# Patient Record
Sex: Male | Born: 1965
Health system: Southern US, Community
[De-identification: ages and names within clinical notes are randomized; demographics above are authoritative.]

## PROBLEM LIST (undated history)

## (undated) HISTORY — PX: TONSILLECTOMY: SUR1361

## (undated) HISTORY — PX: HERNIA REPAIR: SHX51

---

## 2004-02-27 ENCOUNTER — Ambulatory Visit (HOSPITAL_BASED_OUTPATIENT_CLINIC_OR_DEPARTMENT_OTHER): Admission: RE | Admit: 2004-02-27 | Discharge: 2004-02-27 | Payer: Self-pay | Admitting: Orthopedic Surgery

## 2004-02-27 ENCOUNTER — Ambulatory Visit (HOSPITAL_COMMUNITY): Admission: RE | Admit: 2004-02-27 | Discharge: 2004-02-27 | Payer: Self-pay | Admitting: Orthopedic Surgery

## 2004-11-07 ENCOUNTER — Encounter: Admission: RE | Admit: 2004-11-07 | Discharge: 2004-11-07 | Payer: Self-pay | Admitting: Gastroenterology

## 2014-07-10 ENCOUNTER — Telehealth: Payer: Self-pay | Admitting: Family Medicine

## 2014-07-10 NOTE — Telephone Encounter (Signed)
Pt needing appt for new pt to establish care, pt has [employer] no current meds, pt given appt with dr Darlyn Readstacks 07/26/14 @ 12:55. Pt aware to arrive 15 minutes early with insurance card. Will close encounter.

## 2014-07-24 ENCOUNTER — Telehealth: Payer: Self-pay | Admitting: Family Medicine

## 2014-07-24 ENCOUNTER — Ambulatory Visit (INDEPENDENT_AMBULATORY_CARE_PROVIDER_SITE_OTHER): Payer: BLUE CROSS/BLUE SHIELD | Admitting: Family Medicine

## 2014-07-24 ENCOUNTER — Encounter: Payer: Self-pay | Admitting: Family Medicine

## 2014-07-24 ENCOUNTER — Other Ambulatory Visit: Payer: Self-pay | Admitting: Family Medicine

## 2014-07-24 ENCOUNTER — Ambulatory Visit (INDEPENDENT_AMBULATORY_CARE_PROVIDER_SITE_OTHER): Payer: BLUE CROSS/BLUE SHIELD

## 2014-07-24 VITALS — BP 131/81 | HR 106 | Temp 96.9°F | Ht 72.0 in | Wt 175.0 lb

## 2014-07-24 DIAGNOSIS — M545 Low back pain, unspecified: Secondary | ICD-10-CM

## 2014-07-24 DIAGNOSIS — R300 Dysuria: Secondary | ICD-10-CM

## 2014-07-24 LAB — POCT UA - MICROSCOPIC ONLY
Casts, Ur, LPF, POC: NEGATIVE
Crystals, Ur, HPF, POC: NEGATIVE
RBC, urine, microscopic: NEGATIVE
WBC, Ur, HPF, POC: NEGATIVE
Yeast, UA: NEGATIVE

## 2014-07-24 LAB — POCT URINALYSIS DIPSTICK
Bilirubin, UA: NEGATIVE
Blood, UA: NEGATIVE
Glucose, UA: NEGATIVE
Leukocytes, UA: NEGATIVE
Nitrite, UA: NEGATIVE
Spec Grav, UA: 1.025
Urobilinogen, UA: NEGATIVE
pH, UA: 5

## 2014-07-24 MED ORDER — CYCLOBENZAPRINE HCL 10 MG PO TABS: 10.0000 mg | ORAL_TABLET | Freq: Three times a day (TID) | ORAL | Status: DC | PRN

## 2014-07-24 MED ORDER — NAPROXEN 500 MG PO TABS: 500.0000 mg | ORAL_TABLET | Freq: Two times a day (BID) | ORAL | Status: DC

## 2014-07-24 NOTE — Progress Notes (Signed)
   Subjective:    Patient ID: Troy Baker, male    DOB: 05/08/1966, 49 y.o.   MRN: [id number]  HPI Patient c/o back pain on right side that does not radiate.  He c/o dysuria.  He states his pain level is 6 on scale of 1-10.  Review of Systems  Constitutional: Negative for fever.  HENT: Negative for ear pain.   Eyes: Negative for discharge.  Respiratory: Negative for cough.   Cardiovascular: Negative for chest pain.  Gastrointestinal: Negative for abdominal distention.  Endocrine: Negative for polyuria.  Genitourinary: Negative for difficulty urinating.  Musculoskeletal: Negative for gait problem and neck pain.  Skin: Negative for color change and rash.  Neurological: Negative for speech difficulty and headaches.  Psychiatric/Behavioral: Negative for agitation.       Objective:    BP 131/81 mmHg  Pulse 106  Temp(Src) 96.9 F (36.1 C) (Oral)  Ht 6' (1.829 m)  Wt 175 lb (79.379 kg)  BMI 23.73 kg/m2 Physical Exam  Constitutional: He is oriented to person, place, and time. He appears well-developed and well-nourished.  HENT:  Head: Normocephalic and atraumatic.  Mouth/Throat: Oropharynx is clear and moist.  Eyes: Pupils are equal, round, and reactive to light.  Neck: Normal range of motion. Neck supple.  Cardiovascular: Normal rate and regular rhythm.   No murmur heard. Pulmonary/Chest: Effort normal and breath sounds normal.  Abdominal: Soft. Bowel sounds are normal. There is no tenderness.  Neurological: He is alert and oriented to person, place, and time.  Skin: Skin is warm and dry.  Psychiatric: He has a normal mood and affect.    Results for orders placed or performed in visit on 07/24/14  POCT UA - Microscopic Only  Result Value Ref Range   WBC, Ur, HPF, POC neg    RBC, urine, microscopic neg    Bacteria, U Microscopic occ    Mucus, UA mod    Epithelial cells, urine per micros occ    Crystals, Ur, HPF, POC neg    Casts, Ur, LPF, POC neg    Yeast, UA neg    POCT urinalysis dipstick  Result Value Ref Range   Color, UA straw    Clarity, UA cloudy    Glucose, UA neg    Bilirubin, UA neg    Ketones, UA small    Spec Grav, UA 1.025    Blood, UA neg    pH, UA 5.0    Protein, UA 3+++    Urobilinogen, UA negative    Nitrite, UA neg    Leukocytes, UA Negative         Assessment & Plan:     ICD-9-CM ICD-10-CM   1. Dysuria 788.1 R30.0 POCT UA - Microscopic Only     POCT urinalysis dipstick     DG Abd 1 View  2. Right-sided low back pain without sciatica 724.2 M54.5 DG Abd 1 View     No Follow-up on file.  Deatra CanterWilliam J Oxford FNP

## 2014-07-24 NOTE — Telephone Encounter (Signed)
Appointment given for 11:00 today with Ander SladeBill Oxford, FNP.

## 2014-07-26 ENCOUNTER — Encounter: Payer: Self-pay | Admitting: Family Medicine

## 2014-07-26 ENCOUNTER — Ambulatory Visit (INDEPENDENT_AMBULATORY_CARE_PROVIDER_SITE_OTHER): Payer: BLUE CROSS/BLUE SHIELD

## 2014-07-26 ENCOUNTER — Ambulatory Visit (INDEPENDENT_AMBULATORY_CARE_PROVIDER_SITE_OTHER): Payer: BLUE CROSS/BLUE SHIELD | Admitting: Family Medicine

## 2014-07-26 VITALS — BP 111/75 | HR 99 | Temp 97.1°F | Ht 72.0 in | Wt 176.0 lb

## 2014-07-26 DIAGNOSIS — F17200 Nicotine dependence, unspecified, uncomplicated: Secondary | ICD-10-CM

## 2014-07-26 DIAGNOSIS — R809 Proteinuria, unspecified: Secondary | ICD-10-CM

## 2014-07-26 DIAGNOSIS — Z0000 Encounter for general adult medical examination without abnormal findings: Secondary | ICD-10-CM

## 2014-07-26 DIAGNOSIS — R05 Cough: Secondary | ICD-10-CM

## 2014-07-26 DIAGNOSIS — R059 Cough, unspecified: Secondary | ICD-10-CM

## 2014-07-26 DIAGNOSIS — Z139 Encounter for screening, unspecified: Secondary | ICD-10-CM

## 2014-07-26 DIAGNOSIS — Z720 Tobacco use: Secondary | ICD-10-CM

## 2014-07-26 LAB — POCT URINALYSIS DIPSTICK
Bilirubin, UA: NEGATIVE
GLUCOSE UA: NEGATIVE
Ketones, UA: NEGATIVE
Leukocytes, UA: NEGATIVE
Nitrite, UA: NEGATIVE
RBC UA: NEGATIVE
Spec Grav, UA: >=1.03
UROBILINOGEN UA: NEGATIVE
pH, UA: 5

## 2014-07-26 LAB — POCT CBC
GRANULOCYTE PERCENT: 53.8 % (ref 37–80)
HCT, POC: 50.9 % (ref 43.5–53.7)
Hemoglobin: 16.6 g/dL (ref 14.1–18.1)
LYMPH, POC: 2.4 (ref 0.6–3.4)
MCH, POC: 29.9 pg (ref 27–31.2)
MCHC: 32.6 g/dL (ref 31.8–35.4)
MCV: 91.5 fL (ref 80–97)
MPV: 9.3 fL (ref 0–99.8)
PLATELET COUNT, POC: 146 10*3/uL (ref 142–424)
POC GRANULOCYTE: 3.2 (ref 2–6.9)
POC LYMPH PERCENT: 40.8 %L (ref 10–50)
RBC: 5.6 M/uL (ref 4.69–6.13)
RDW, POC: 13.4 %
WBC: 6 10*3/uL (ref 4.6–10.2)

## 2014-07-26 MED ORDER — VARENICLINE TARTRATE 0.5 MG X 11 & 1 MG X 42 PO MISC: ORAL | Status: DC

## 2014-07-26 NOTE — Progress Notes (Addendum)
Subjective:    Patient ID: Troy Baker, male    DOB: 1965/08/25, 49 y.o.   MRN: [id number]  Cough Pertinent negatives include no chills, fever, headaches, rash, rhinorrhea, sore throat, shortness of breath or wheezing.   Patient's had a cough now for about 5 days. He is bringing up brown phlegm. He denies any shortness of breath. He is a smoker for about 35 years. He averages about 2 packs per day.  She was in a couple of days ago and noted to have proteinuria. He also has some low back pain he split was found to be constipated however he has not had a bowel movement. The pain has subsided. In spite of the lack of bowel movement. It's unclear whether the proteinuria was incidental or represented a significant kidney problem. No history of kidney disease or stones.  Review of Systems  Constitutional: Negative for fever, chills, diaphoresis and unexpected weight change.  HENT: Negative for congestion, hearing loss, rhinorrhea, sore throat and trouble swallowing.   Respiratory: Negative for cough, chest tightness, shortness of breath and wheezing.   Gastrointestinal: Negative for nausea, vomiting, abdominal pain, diarrhea, constipation and abdominal distention.  Endocrine: Negative for cold intolerance and heat intolerance.  Genitourinary: Negative for dysuria, hematuria and flank pain.  Musculoskeletal: Negative for joint swelling and arthralgias.  Skin: Negative for rash.  Neurological: Negative for dizziness and headaches.  Psychiatric/Behavioral: Negative for dysphoric mood, decreased concentration and agitation. The patient is not nervous/anxious.        Objective:   Physical Exam  Constitutional: He is oriented to person, place, and time. He appears well-developed and well-nourished. No distress.  HENT:  Head: Normocephalic and atraumatic.  Right Ear: External ear normal.  Left Ear: External ear normal.  Nose: Nose normal.  Mouth/Throat: Oropharynx is clear and moist.    Eyes: Conjunctivae and EOM are normal. Pupils are equal, round, and reactive to light.  Neck: Normal range of motion. Neck supple. No thyromegaly present.  Cardiovascular: Normal rate, regular rhythm and normal heart sounds.   No murmur heard. Pulmonary/Chest: Effort normal and breath sounds normal. No respiratory distress. He has no wheezes. He has no rales.  Abdominal: Soft. Bowel sounds are normal. He exhibits no distension. There is no tenderness.  Lymphadenopathy:    He has no cervical adenopathy.  Neurological: He is alert and oriented to person, place, and time. He has normal reflexes.  Skin: Skin is warm and dry.  Psychiatric: He has a normal mood and affect. His behavior is normal. Judgment and thought content normal.   BP 111/75 mmHg  Pulse 99  Temp(Src) 97.1 F (36.2 C) (Oral)  Ht 6' (1.829 m)  Wt 176 lb (79.833 kg)  BMI 23.86 kg/m2         Assessment & Plan:   1. Cough   2. Screening   3. Proteinuria   4. Tobacco use disorder     Meds ordered this encounter  Medications  . varenicline (CHANTIX STARTING MONTH PAK) 0.5 MG X 11 & 1 MG X 42 tablet    Sig: Take one 0.5 mg tablet by mouth once daily for 3 days, then increase to one 0.5 mg tablet twice daily for 4 days, then increase to one 1 mg tablet twice daily.    Dispense:  53 tablet    Refill:  0    Orders Placed This Encounter  Procedures  . DG Chest 2 View    Order Specific Question:  Reason for  Exam (SYMPTOM  OR DIAGNOSIS REQUIRED)    Answer:  cough    Order Specific Question:  Preferred imaging location?    Answer:  Internal  . CMP14+EGFR  . NMR, lipoprofile  . PSA, total and free  . Thyroid Panel With TSH  . Vit D  25 hydroxy (rtn osteoporosis monitoring)  . POCT CBC  . Urinalysis Dipstick    Labs pending Health Maintenance reviewed Diet and exercise encouraged Continue all meds as discussed Follow up in 1 year  Claretta Fraise, MD

## 2014-07-26 NOTE — Progress Notes (Deleted)
   Subjective:    Patient ID: Troy SprangSheldon E Cales, male    DOB: 01/19/1966, 49 y.o.   MRN: [id number]  HPI  Patient is here today for an annual exam.  He is also has a cough that started on Monday.  It is a productive cough but denies fever.       No Known Allergies  Outpatient Encounter Prescriptions as of 07/26/2014  Medication Sig  . cyclobenzaprine (FLEXERIL) 10 MG tablet Take 1 tablet (10 mg total) by mouth 3 (three) times daily as needed for muscle spasms.  . naproxen (NAPROSYN) 500 MG tablet Take 1 tablet (500 mg total) by mouth 2 (two) times daily with a meal.    No past medical history on file.  Past Surgical History  Procedure Laterality Date  . Hernia repair Right   . Tonsillectomy      History   Social History  . Marital Status: Single    Spouse Name: N/A    Number of Children: N/A  . Years of Education: N/A   Occupational History  . Not on file.   Social History Main Topics  . Smoking status: Heavy Tobacco Smoker -- 2.00 packs/day  . Smokeless tobacco: Not on file  . Alcohol Use: No  . Drug Use: No  . Sexual Activity: Not on file   Other Topics Concern  . Not on file   Social History Narrative    Review of Systems     Objective:   Physical Exam  BP 111/75 mmHg  Pulse 99  Temp(Src) 97.1 F (36.2 C) (Oral)  Ht 6' (1.829 m)  Wt 176 lb (79.833 kg)  BMI 23.86 kg/m2         Assessment & Plan:

## 2014-07-26 NOTE — Patient Instructions (Signed)
Be sure to moisturize your skin regularly, preferably with a collagen containing lotion.

## 2014-07-27 LAB — NMR, LIPOPROFILE
Cholesterol: 176 mg/dL (ref 100–199)
HDL CHOLESTEROL BY NMR: 35 mg/dL — AB (ref >39)
HDL Particle Number: 21.4 umol/L — ABNORMAL LOW (ref >=30.5)
LDL PARTICLE NUMBER: 1344 nmol/L — AB (ref <1000)
LDL Size: 21.6 nm (ref >20.5)
LDL-C: 126 mg/dL — ABNORMAL HIGH (ref 0–99)
LP-IR SCORE: 56 — AB (ref <=45)
SMALL LDL PARTICLE NUMBER: 394 nmol/L (ref <=527)
TRIGLYCERIDES BY NMR: 75 mg/dL (ref 0–149)

## 2014-07-27 LAB — CMP14+EGFR
A/G RATIO: 2.4 (ref 1.1–2.5)
ALBUMIN: 4.8 g/dL (ref 3.5–5.5)
ALT: 26 IU/L (ref 0–44)
AST: 20 IU/L (ref 0–40)
Alkaline Phosphatase: 87 IU/L (ref 39–117)
BILIRUBIN TOTAL: 0.4 mg/dL (ref 0.0–1.2)
BUN/Creatinine Ratio: 15 (ref 9–20)
BUN: 14 mg/dL (ref 6–24)
CHLORIDE: 100 mmol/L (ref 97–108)
CO2: 25 mmol/L (ref 18–29)
Calcium: 9.2 mg/dL (ref 8.7–10.2)
Creatinine, Ser: 0.94 mg/dL (ref 0.76–1.27)
GFR calc Af Amer: 111 mL/min/{1.73_m2} (ref >59)
GFR, EST NON AFRICAN AMERICAN: 96 mL/min/{1.73_m2} (ref >59)
GLUCOSE: 91 mg/dL (ref 65–99)
Globulin, Total: 2 g/dL (ref 1.5–4.5)
Potassium: 5 mmol/L (ref 3.5–5.2)
SODIUM: 140 mmol/L (ref 134–144)
TOTAL PROTEIN: 6.8 g/dL (ref 6.0–8.5)

## 2014-07-27 LAB — THYROID PANEL WITH TSH
Free Thyroxine Index: 2.9 (ref 1.2–4.9)
T3 UPTAKE RATIO: 25 % (ref 24–39)
T4, Total: 11.5 ug/dL (ref 4.5–12.0)
TSH: 1.8 u[IU]/mL (ref 0.450–4.500)

## 2014-07-27 LAB — PSA, TOTAL AND FREE
PSA, Free Pct: 23.3 %
PSA, Free: 0.21 ng/mL
PSA: 0.9 ng/mL (ref 0.0–4.0)

## 2014-07-27 LAB — VITAMIN D 25 HYDROXY (VIT D DEFICIENCY, FRACTURES): VIT D 25 HYDROXY: 13.8 ng/mL — AB (ref 30.0–100.0)

## 2014-07-31 ENCOUNTER — Telehealth: Payer: Self-pay | Admitting: Family Medicine

## 2014-07-31 NOTE — Telephone Encounter (Signed)
Please review NMR and advise

## 2014-07-31 NOTE — Telephone Encounter (Signed)
I think overall he is in pretty good shape. His HDL cholesterol is slightly low. His LDL is slightly high. However, the excellent number for his small LDL particles and his LDL particle size are enough to offset those minimal variations. Therefore I think a low-fat diet and regular exercise and keep a close eye on it. Medication may become necessary if his HDL cholesterol falls further.

## 2014-08-01 ENCOUNTER — Encounter: Payer: Self-pay | Admitting: *Deleted

## 2014-08-03 NOTE — Addendum Note (Signed)
Addended by: Mechele ClaudeSTACKS, Jiali Linney on: 08/03/2014 10:05 AM   Modules accepted: Kipp BroodSmartSet

## 2014-08-06 NOTE — Telephone Encounter (Signed)
Pt notified of results Verbalizes understanding 

## 2014-08-08 ENCOUNTER — Telehealth: Payer: Self-pay | Admitting: *Deleted

## 2014-08-08 NOTE — Telephone Encounter (Signed)
[facility], please call in today, picking up tonight

## 2014-08-09 ENCOUNTER — Telehealth: Payer: Self-pay | Admitting: Family Medicine

## 2014-08-09 MED ORDER — VITAMIN D (ERGOCALCIFEROL) 1.25 MG (50000 UNIT) PO CAPS: ORAL_CAPSULE | ORAL | Status: DC

## 2014-08-09 NOTE — Telephone Encounter (Signed)
RX done. 

## 2014-08-09 NOTE — Telephone Encounter (Signed)
Vitamin D sent to pharmacy per patient request

## 2015-01-17 ENCOUNTER — Ambulatory Visit (INDEPENDENT_AMBULATORY_CARE_PROVIDER_SITE_OTHER): Payer: BLUE CROSS/BLUE SHIELD | Admitting: Family Medicine

## 2015-01-17 ENCOUNTER — Ambulatory Visit (INDEPENDENT_AMBULATORY_CARE_PROVIDER_SITE_OTHER): Payer: BLUE CROSS/BLUE SHIELD

## 2015-01-17 ENCOUNTER — Encounter: Payer: Self-pay | Admitting: Family Medicine

## 2015-01-17 VITALS — BP 125/83 | HR 87 | Temp 97.0°F | Ht 72.0 in | Wt 174.8 lb

## 2015-01-17 DIAGNOSIS — Z23 Encounter for immunization: Secondary | ICD-10-CM | POA: Diagnosis not present

## 2015-01-17 DIAGNOSIS — M79641 Pain in right hand: Secondary | ICD-10-CM

## 2015-01-17 MED ORDER — DICLOFENAC SODIUM 75 MG PO TBEC: 75.0000 mg | DELAYED_RELEASE_TABLET | Freq: Two times a day (BID) | ORAL | Status: DC

## 2015-01-17 NOTE — Progress Notes (Signed)
Subjective:  Patient ID: Troy Baker, male    DOB: 1965/11/25  Age: 49 y.o. MRN: [id number]  CC: Hand Pain   HPI Troy Baker presents for right hand swelling. Not numb. Pain with movement. Describes heavy work daily with frequent grip and flexion against force required. Worse after work. Relief after rest.6-7/10  History Troy Baker has no past medical history on file.   He has past surgical history that includes Hernia repair (Right) and Tonsillectomy.   His family history includes Diabetes in his maternal aunt, maternal grandmother, maternal uncle, and mother.He reports that he has been smoking.  He does not have any smokeless tobacco history on file. He reports that he does not drink alcohol or use illicit drugs.  Outpatient Prescriptions Prior to Visit  Medication Sig Dispense Refill  . cyclobenzaprine (FLEXERIL) 10 MG tablet Take 1 tablet (10 mg total) by mouth 3 (three) times daily as needed for muscle spasms. (Patient not taking: Reported on 01/17/2015) 30 tablet 0  . naproxen (NAPROSYN) 500 MG tablet Take 1 tablet (500 mg total) by mouth 2 (two) times daily with a meal. (Patient not taking: Reported on 01/17/2015) 30 tablet 0  . varenicline (CHANTIX STARTING MONTH PAK) 0.5 MG X 11 & 1 MG X 42 tablet Take one 0.5 mg tablet by mouth once daily for 3 days, then increase to one 0.5 mg tablet twice daily for 4 days, then increase to one 1 mg tablet twice daily. (Patient not taking: Reported on 01/17/2015) 53 tablet 0  . Vitamin D, Ergocalciferol, (DRISDOL) 50000 UNITS CAPS capsule Take 1 tablet twice weekly for 2 months (Patient not taking: Reported on 01/17/2015) 16 capsule 0   No facility-administered medications prior to visit.    ROS Review of Systems  Constitutional: Negative for fever, chills and diaphoresis.  HENT: Negative for congestion, rhinorrhea and sore throat.   Respiratory: Negative for cough, shortness of breath and wheezing.   Cardiovascular: Negative for chest  pain.  Gastrointestinal: Negative for nausea, vomiting, abdominal pain, diarrhea, constipation and abdominal distention.  Genitourinary: Negative for dysuria and frequency.  Musculoskeletal: Negative for joint swelling and arthralgias.  Skin: Negative for rash.  Neurological: Negative for headaches.    Objective:  BP 125/83 mmHg  Pulse 87  Temp(Src) 97 F (36.1 C) (Oral)  Ht 6' (1.829 m)  Wt 174 lb 12.8 oz (79.289 kg)  BMI 23.70 kg/m2  BP Readings from Last 3 Encounters:  01/17/15 125/83  07/26/14 111/75  07/24/14 131/81    Wt Readings from Last 3 Encounters:  01/17/15 174 lb 12.8 oz (79.289 kg)  07/26/14 176 lb (79.833 kg)  07/24/14 175 lb (79.379 kg)     Physical Exam  Constitutional: He appears well-developed and well-nourished.  HENT:  Head: Normocephalic and atraumatic.  Right Ear: External ear normal.  Left Ear: External ear normal.  Mouth/Throat: No oropharyngeal exudate or posterior oropharyngeal erythema.  Eyes: Pupils are equal, round, and reactive to light.  Neck: Normal range of motion. Neck supple.  Cardiovascular: Normal rate and regular rhythm.   No murmur heard. Pulmonary/Chest: Breath sounds normal. No respiratory distress.  Abdominal: Bowel sounds are normal.  Musculoskeletal: He exhibits tenderness (right wrist for flexion and grip. Point tender at ulnar styloid. NKI).  Vitals reviewed.   No results found for: HGBA1C  Lab Results  Component Value Date   WBC 6.0 07/26/2014   HGB 16.6 07/26/2014   HCT 50.9 07/26/2014   GLUCOSE 91 07/26/2014   CHOL 176  07/26/2014   TRIG 75 07/26/2014   HDL 35* 07/26/2014   ALT 26 07/26/2014   AST 20 07/26/2014   NA 140 07/26/2014   K 5.0 07/26/2014   CL 100 07/26/2014   CREATININE 0.94 07/26/2014   BUN 14 07/26/2014   CO2 25 07/26/2014   TSH 1.800 07/26/2014   PSA 0.9 07/26/2014    US Abdomen Complete  11/07/2004   Clinical Data:   Right upper quadrant pain.   ULTRASOUND ABDOMEN, COMPLETE -  11/07/04: There is no evidence of gallstones or gallbladder wall thickening.  There is no evidence of biliary ductal dilatation.  The liver is within normal limits in echogenicity and no focal parenchymal lesions are identified.  Assessment of the pancreas is slightly limited due to overlying gastrointestinal gas.  The kidneys are within normal limits in size and echogenicity and there is no evidence of masses or hydronephrosis.  There is no evidence of splenomegaly or abdominal aortic aneurysm.  Images of the inferior vena cava are unremarkable, and there is no evidence of ascites. IMPRESSION: 1.  Slightly limited assessment of pancreas due to overlying gastrointestinal gas. 2. Otherwise normal.   Provider: Hilbert Bible   Assessment & Plan:   Troy Baker was seen today for hand pain.  Diagnoses and all orders for this visit:  Hand pain, right Orders: -     DG Hand Complete Right; Future  Other orders -     Tdap vaccine greater than or equal to 7yo IM -     diclofenac (VOLTAREN) 75 MG EC tablet; Take 1 tablet (75 mg total) by mouth 2 (two) times daily.  I have discontinued Troy Baker naproxen, cyclobenzaprine, varenicline, and Vitamin D (Ergocalciferol). I am also having him start on diclofenac.  Meds ordered this encounter  Medications  . diclofenac (VOLTAREN) 75 MG EC tablet    Sig: Take 1 tablet (75 mg total) by mouth 2 (two) times daily.    Dispense:  60 tablet    Refill:  2   XR, hand, no acute injury Decrease use. Wear brace at all times for 1 month. Follow-up: No Follow-up on file.  Mechele Claude, M.D.

## 2015-01-17 NOTE — Patient Instructions (Addendum)
Wear brace for 2-4 weeks for all activities

## 2015-06-10 ENCOUNTER — Encounter: Payer: Self-pay | Admitting: Family Medicine

## 2015-06-10 ENCOUNTER — Ambulatory Visit (INDEPENDENT_AMBULATORY_CARE_PROVIDER_SITE_OTHER): Payer: BLUE CROSS/BLUE SHIELD | Admitting: Family Medicine

## 2015-06-10 ENCOUNTER — Other Ambulatory Visit: Payer: Self-pay | Admitting: Family Medicine

## 2015-06-10 VITALS — BP 127/80 | HR 92 | Temp 98.0°F | Ht 72.0 in | Wt 171.0 lb

## 2015-06-10 DIAGNOSIS — N5089 Other specified disorders of the male genital organs: Secondary | ICD-10-CM

## 2015-06-10 DIAGNOSIS — R319 Hematuria, unspecified: Secondary | ICD-10-CM | POA: Diagnosis not present

## 2015-06-10 DIAGNOSIS — R52 Pain, unspecified: Secondary | ICD-10-CM

## 2015-06-10 LAB — POCT UA - MICROSCOPIC ONLY
CASTS, UR, LPF, POC: NEGATIVE
Crystals, Ur, HPF, POC: NEGATIVE
Mucus, UA: NEGATIVE
YEAST UA: NEGATIVE

## 2015-06-10 LAB — POCT URINALYSIS DIPSTICK
Bilirubin, UA: NEGATIVE
Glucose, UA: NEGATIVE
Ketones, UA: NEGATIVE
NITRITE UA: POSITIVE
PH UA: 6
Spec Grav, UA: 1.02
UROBILINOGEN UA: NEGATIVE

## 2015-06-10 NOTE — Progress Notes (Signed)
BP 127/80 mmHg  Pulse 92  Temp(Src) 98 F (36.7 C) (Oral)  Ht 6' (1.829 m)  Wt 171 lb (77.565 kg)  BMI 23.19 kg/m2   Subjective:    Patient ID: Troy Baker, male    DOB: 06/16/1966, 49 y.o.   MRN: [id number]  HPI: Troy Baker is a 49 y.o. male presenting on 06/10/2015 for Groin Swelling and Hematuria   HPI Left testicular swelling and hematuria Patient has a three-day history of hematuria that was gross on the first day and intermittent since then. He has also had dysuria and then 2 days ago he developed swelling in his left testicle or gonadal sac. He had some sharp pain in that left gonadal sac 2 days ago and that has come and gone intermittently since that time. He did not end up going and seeing anybody at that time for this. He denies any fevers or chills or abdominal pain or flank pain. He does feel like the left testicular pain is worse with bouncing or being jarred. His never had a similar issue before.  Relevant past medical, surgical, family and social history reviewed and updated as indicated. Interim medical history since our last visit reviewed. Allergies and medications reviewed and updated.  Review of Systems  Constitutional: Negative for fever.  HENT: Negative for ear discharge and ear pain.   Eyes: Negative for discharge and visual disturbance.  Respiratory: Negative for shortness of breath and wheezing.   Cardiovascular: Negative for chest pain and leg swelling.  Gastrointestinal: Negative for abdominal pain, diarrhea and constipation.  Genitourinary: Positive for dysuria, hematuria, scrotal swelling and testicular pain. Negative for urgency, flank pain, decreased urine volume, discharge, penile swelling, difficulty urinating and penile pain.  Musculoskeletal: Negative for back pain and gait problem.  Skin: Negative for rash.  Neurological: Negative for syncope, light-headedness and headaches.  All other systems reviewed and are negative.   Per HPI  unless specifically indicated above     Medication List    Notice  As of 06/10/2015  4:22 PM   You have not been prescribed any medications.         Objective:    BP 127/80 mmHg  Pulse 92  Temp(Src) 98 F (36.7 C) (Oral)  Ht 6' (1.829 m)  Wt 171 lb (77.565 kg)  BMI 23.19 kg/m2  Wt Readings from Last 3 Encounters:  06/10/15 171 lb (77.565 kg)  01/17/15 174 lb 12.8 oz (79.289 kg)  07/26/14 176 lb (79.833 kg)    Physical Exam  Constitutional: He is oriented to person, place, and time. He appears well-developed and well-nourished. No distress.  Eyes: Conjunctivae and EOM are normal. Pupils are equal, round, and reactive to light. Right eye exhibits no discharge. No scleral icterus.  Cardiovascular: Normal rate, regular rhythm, normal heart sounds and intact distal pulses.   No murmur heard. Pulmonary/Chest: Effort normal and breath sounds normal. No respiratory distress. He has no wheezes.  Abdominal: He exhibits no distension. There is no tenderness. There is no rebound. Hernia confirmed negative in the right inguinal area and confirmed negative in the left inguinal area.  Genitourinary: Penis normal. Right testis shows no mass, no swelling and no tenderness. Cremasteric reflex is not absent on the right side. Left testis shows swelling and tenderness. Cremasteric reflex is not absent on the left side. Circumcised.  Musculoskeletal: Normal range of motion. He exhibits no edema.  Lymphadenopathy:       Right: No inguinal adenopathy present.  Left: No inguinal adenopathy present.  Neurological: He is alert and oriented to person, place, and time. Coordination normal.  Skin: Skin is warm and dry. No rash noted. He is not diaphoretic.  Psychiatric: He has a normal mood and affect. His behavior is normal.  Nursing note and vitals reviewed.   Results for orders placed or performed in visit on 07/26/14  CMP14+EGFR  Result Value Ref Range   Glucose 91 65 - 99 mg/dL   BUN 14  6 - 24 mg/dL   Creatinine, Ser 0.94 0.76 - 1.27 mg/dL   GFR calc non Af Amer 96 >59 mL/min/1.73   GFR calc Af Amer 111 >59 mL/min/1.73   BUN/Creatinine Ratio 15 9 - 20   Sodium 140 134 - 144 mmol/L   Potassium 5.0 3.5 - 5.2 mmol/L   Chloride 100 97 - 108 mmol/L   CO2 25 18 - 29 mmol/L   Calcium 9.2 8.7 - 10.2 mg/dL   Total Protein 6.8 6.0 - 8.5 g/dL   Albumin 4.8 3.5 - 5.5 g/dL   Globulin, Total 2.0 1.5 - 4.5 g/dL   Albumin/Globulin Ratio 2.4 1.1 - 2.5   Total Bilirubin 0.4 0.0 - 1.2 mg/dL   Alkaline Phosphatase 87 39 - 117 IU/L   AST 20 0 - 40 IU/L   ALT 26 0 - 44 IU/L  NMR, lipoprofile  Result Value Ref Range   LDL Particle Number 1344 (H) <1000 nmol/L   LDL-C 126 (H) 0 - 99 mg/dL   HDL Cholesterol by NMR 35 (L) >39 mg/dL   Triglycerides by NMR 75 0 - 149 mg/dL   Cholesterol 176 100 - 199 mg/dL   HDL Particle Number 21.4 (L) >=30.5 umol/L   Small LDL Particle Number 394 <=527 nmol/L   LDL Size 21.6 >20.5 nm   LP-IR Score 56 (H) <=45  PSA, total and free  Result Value Ref Range   PSA 0.9 0.0 - 4.0 ng/mL   PSA, Free 0.21 N/A ng/mL   PSA, Free Pct 23.3 %  Thyroid Panel With TSH  Result Value Ref Range   TSH 1.800 0.450 - 4.500 uIU/mL   T4, Total 11.5 4.5 - 12.0 ug/dL   T3 Uptake Ratio 25 24 - 39 %   Free Thyroxine Index 2.9 1.2 - 4.9  Vit D  25 hydroxy (rtn osteoporosis monitoring)  Result Value Ref Range   Vit D, 25-Hydroxy 13.8 (L) 30.0 - 100.0 ng/mL  POCT CBC  Result Value Ref Range   WBC 6.0 4.6 - 10.2 K/uL   Lymph, poc 2.4 0.6 - 3.4   POC LYMPH PERCENT 40.8 10 - 50 %L   POC Granulocyte 3.2 2 - 6.9   Granulocyte percent 53.8 37 - 80 %G   RBC 5.6 4.69 - 6.13 M/uL   Hemoglobin 16.6 14.1 - 18.1 g/dL   HCT, POC 50.9 43.5 - 53.7 %   MCV 91.5 80 - 97 fL   MCH, POC 29.9 27 - 31.2 pg   MCHC 32.6 31.8 - 35.4 g/dL   RDW, POC 13.4 %   Platelet Count, POC 146.0 142 - 424 K/uL   MPV 9.3 0 - 99.8 fL  Urinalysis Dipstick  Result Value Ref Range   Color, UA gold     Clarity, UA clear    Glucose, UA negative    Bilirubin, UA negative    Ketones, UA negative    Spec Grav, UA >=1.030    Blood, UA negative  pH, UA 5.0    Protein, UA 2+    Urobilinogen, UA negative    Nitrite, UA negative    Leukocytes, UA Negative       Assessment & Plan:   Problem List Items Addressed This Visit    None    Visit Diagnoses    Hematuria    -  Primary    Relevant Orders    POCT UA - Microscopic Only (Completed)    POCT urinalysis dipstick (Completed)    US Scrotum (Completed)    Urine culture (Completed)    Testicular swelling, left        Relevant Orders    US Scrotum (Completed)    Urine culture (Completed)        Follow up plan: Return if symptoms worsen or fail to improve.  Counseling provided for all of the vaccine components Orders Placed This Encounter  Procedures  . US Scrotum  . POCT UA - Microscopic Only  . POCT urinalysis dipstick    Caryl Pina, MD [facility] 06/10/2015, 4:22 PM

## 2015-06-11 ENCOUNTER — Ambulatory Visit (HOSPITAL_COMMUNITY)
Admission: RE | Admit: 2015-06-11 | Discharge: 2015-06-11 | Disposition: A | Discharge status: Home / Self Care | Payer: BLUE CROSS/BLUE SHIELD | Source: Ambulatory Visit | Attending: Family Medicine | Admitting: Family Medicine

## 2015-06-11 ENCOUNTER — Encounter: Payer: Self-pay | Admitting: *Deleted

## 2015-06-11 DIAGNOSIS — N433 Hydrocele, unspecified: Secondary | ICD-10-CM | POA: Insufficient documentation

## 2015-06-11 DIAGNOSIS — N5089 Other specified disorders of the male genital organs: Secondary | ICD-10-CM

## 2015-06-11 DIAGNOSIS — N50812 Left testicular pain: Secondary | ICD-10-CM | POA: Insufficient documentation

## 2015-06-11 DIAGNOSIS — R319 Hematuria, unspecified: Secondary | ICD-10-CM

## 2015-06-11 DIAGNOSIS — R52 Pain, unspecified: Secondary | ICD-10-CM

## 2015-06-12 ENCOUNTER — Ambulatory Visit (INDEPENDENT_AMBULATORY_CARE_PROVIDER_SITE_OTHER): Payer: BLUE CROSS/BLUE SHIELD | Admitting: *Deleted

## 2015-06-12 ENCOUNTER — Telehealth: Payer: Self-pay | Admitting: Family Medicine

## 2015-06-12 DIAGNOSIS — N451 Epididymitis: Secondary | ICD-10-CM | POA: Diagnosis not present

## 2015-06-12 LAB — URINE CULTURE

## 2015-06-12 MED ORDER — DOXYCYCLINE HYCLATE 100 MG PO TABS: 100.0000 mg | ORAL_TABLET | Freq: Two times a day (BID) | ORAL | Status: DC

## 2015-06-12 MED ORDER — SULFAMETHOXAZOLE-TRIMETHOPRIM 800-160 MG PO TABS: 1.0000 | ORAL_TABLET | Freq: Two times a day (BID) | ORAL | Status: DC

## 2015-06-12 MED ORDER — CEFTRIAXONE SODIUM 250 MG IJ SOLR
250.0000 mg | Freq: Once | INTRAMUSCULAR | Status: AC
  Administered 2015-06-12: 250 mg via INTRAMUSCULAR

## 2015-06-12 NOTE — Progress Notes (Signed)
Pt given Rocephin 250mg  ordered per Dr Dettinger Pt tolerated inj well

## 2015-06-12 NOTE — Telephone Encounter (Signed)
Patient aware of results and will come by office today for rocephin and then rx for doxycycline sent to pharmacy. I call and discontinued the bactrim as stated by Dettinger.

## 2015-06-28 ENCOUNTER — Encounter: Payer: Self-pay | Admitting: Family Medicine

## 2015-06-28 ENCOUNTER — Ambulatory Visit (INDEPENDENT_AMBULATORY_CARE_PROVIDER_SITE_OTHER): Payer: BLUE CROSS/BLUE SHIELD | Admitting: Family Medicine

## 2015-06-28 VITALS — BP 128/83 | HR 71 | Temp 97.0°F | Ht 72.0 in | Wt 170.2 lb

## 2015-06-28 DIAGNOSIS — N50812 Left testicular pain: Secondary | ICD-10-CM

## 2015-06-28 NOTE — Progress Notes (Signed)
BP 128/83 mmHg  Pulse 71  Temp(Src) 97 F (36.1 C) (Oral)  Ht 6' (1.829 m)  Wt 170 lb 3.2 oz (77.202 kg)  BMI 23.08 kg/m2   Subjective:    Patient ID: Troy Baker, male    DOB: 11-25-1965, 50 y.o.   MRN: [id number]  HPI: Troy Baker is a 50 y.o. male presenting on 06/28/2015 for Testicular pain and swelling   HPI Left testicular pain Patient presents today because he has been still having the left testicular pain that he was having previously. His urinary tract infection symptoms such as burning and pain when he urinates and dark and malodorous urine are all resolved. His testicular pain had improved then I'll send today came back again. He denies any fevers or chills. He says he still has some swelling down in the genital region on his left gonadal sac reveals a hard lump there on the backside.  Relevant past medical, surgical, family and social history reviewed and updated as indicated. Interim medical history since our last visit reviewed. Allergies and medications reviewed and updated.  Review of Systems  Constitutional: Negative for fever and chills.  HENT: Negative for ear discharge and ear pain.   Eyes: Negative for discharge and visual disturbance.  Respiratory: Negative for shortness of breath and wheezing.   Cardiovascular: Negative for chest pain and leg swelling.  Gastrointestinal: Negative for abdominal pain, diarrhea and constipation.  Genitourinary: Positive for scrotal swelling and testicular pain. Negative for dysuria, urgency, frequency, hematuria, flank pain, decreased urine volume, discharge, penile swelling, difficulty urinating, genital sores and penile pain.  Musculoskeletal: Negative for back pain and gait problem.  Skin: Negative for rash.  Neurological: Negative for dizziness, syncope, light-headedness and headaches.  All other systems reviewed and are negative.   Per HPI unless specifically indicated above     Medication List    Notice   As of 06/28/2015 10:46 AM   You have not been prescribed any medications.         Objective:    BP 128/83 mmHg  Pulse 71  Temp(Src) 97 F (36.1 C) (Oral)  Ht 6' (1.829 m)  Wt 170 lb 3.2 oz (77.202 kg)  BMI 23.08 kg/m2  Wt Readings from Last 3 Encounters:  06/28/15 170 lb 3.2 oz (77.202 kg)  06/10/15 171 lb (77.565 kg)  01/17/15 174 lb 12.8 oz (79.289 kg)    Physical Exam  Constitutional: He is oriented to person, place, and time. He appears well-developed and well-nourished. No distress.  Eyes: Conjunctivae and EOM are normal. Pupils are equal, round, and reactive to light. Right eye exhibits no discharge. No scleral icterus.  Neck: Neck supple. No thyromegaly present.  Cardiovascular: Normal rate, regular rhythm, normal heart sounds and intact distal pulses.   No murmur heard. Pulmonary/Chest: Effort normal and breath sounds normal. No respiratory distress. He has no wheezes.  Abdominal: He exhibits no distension. There is no tenderness. There is no rebound.  Musculoskeletal: Normal range of motion. He exhibits no edema.  Lymphadenopathy:    He has no cervical adenopathy.  Neurological: He is alert and oriented to person, place, and time. Coordination normal.  Skin: Skin is warm and dry. No rash noted. He is not diaphoretic.  Psychiatric: He has a normal mood and affect. His behavior is normal.  Vitals reviewed.   Results for orders placed or performed in visit on 06/10/15  Urine culture  Result Value Ref Range   Urine Culture, Routine Final report (  A)    Urine Culture result 1 Escherichia coli (A)    ANTIMICROBIAL SUSCEPTIBILITY Comment   POCT UA - Microscopic Only  Result Value Ref Range   WBC, Ur, HPF, POC 150-200    RBC, urine, microscopic 10-12    Bacteria, U Microscopic many    Mucus, UA neg    Epithelial cells, urine per micros occ    Crystals, Ur, HPF, POC neg    Casts, Ur, LPF, POC neg    Yeast, UA neg   POCT urinalysis dipstick  Result Value Ref Range    Color, UA gold    Clarity, UA clear    Glucose, UA neg    Bilirubin, UA neg    Ketones, UA neg    Spec Grav, UA 1.020    Blood, UA mod    pH, UA 6.0    Protein, UA trace    Urobilinogen, UA negative    Nitrite, UA pos    Leukocytes, UA large (3+) (A) Negative      Assessment & Plan:   Problem List Items Addressed This Visit    None    Visit Diagnoses    Left testicular pain    -  Primary    Patient finish full course of treatment, was improved but now is worsening again, will send to urology    Relevant Orders    Ambulatory referral to Urology        Follow up plan: Return if symptoms worsen or fail to improve.  Counseling provided for all of the vaccine components Orders Placed This Encounter  Procedures  . Ambulatory referral to Urology    Arville CareJoshua Nickalas Mccarrick, MD [facility] 06/28/2015, 10:46 AM

## 2015-07-05 ENCOUNTER — Ambulatory Visit (INDEPENDENT_AMBULATORY_CARE_PROVIDER_SITE_OTHER): Payer: BLUE CROSS/BLUE SHIELD | Admitting: Urology

## 2015-07-05 DIAGNOSIS — N451 Epididymitis: Secondary | ICD-10-CM | POA: Diagnosis not present

## 2015-07-05 DIAGNOSIS — N3020 Other chronic cystitis without hematuria: Secondary | ICD-10-CM

## 2015-07-10 ENCOUNTER — Other Ambulatory Visit: Payer: Self-pay | Admitting: Urology

## 2015-07-10 DIAGNOSIS — N3020 Other chronic cystitis without hematuria: Secondary | ICD-10-CM

## 2015-07-30 ENCOUNTER — Ambulatory Visit (HOSPITAL_COMMUNITY)
Admission: RE | Admit: 2015-07-30 | Discharge: 2015-07-30 | Disposition: A | Discharge status: Home / Self Care | Payer: BLUE CROSS/BLUE SHIELD | Source: Ambulatory Visit | Attending: Urology | Admitting: Urology

## 2015-07-30 DIAGNOSIS — N3020 Other chronic cystitis without hematuria: Secondary | ICD-10-CM | POA: Diagnosis not present

## 2015-08-09 ENCOUNTER — Ambulatory Visit (INDEPENDENT_AMBULATORY_CARE_PROVIDER_SITE_OTHER): Payer: BLUE CROSS/BLUE SHIELD | Admitting: Urology

## 2015-08-09 DIAGNOSIS — N3020 Other chronic cystitis without hematuria: Secondary | ICD-10-CM

## 2015-08-09 DIAGNOSIS — N433 Hydrocele, unspecified: Secondary | ICD-10-CM | POA: Diagnosis not present

## 2015-08-09 DIAGNOSIS — Z87438 Personal history of other diseases of male genital organs: Secondary | ICD-10-CM | POA: Diagnosis not present

## 2016-12-25 IMAGING — US US SCROTUM
1 series · 13 of 25 positions shown · non-contrast
Comparison: No prior.

CLINICAL DATA: Left testicular pain and swelling.

EXAM:
SCROTAL ULTRASOUND
DOPPLER ULTRASOUND OF THE TESTICLES
TECHNIQUE: Complete ultrasound examination of the testicles, epididymis, and
other scrotal structures was performed. Color and spectral Doppler
ultrasound were also utilized to evaluate blood flow to the
testicles.

[Series 1: us scrotum · 0.07mm/px · 13 of 101 slices shown]
[im 1/101]
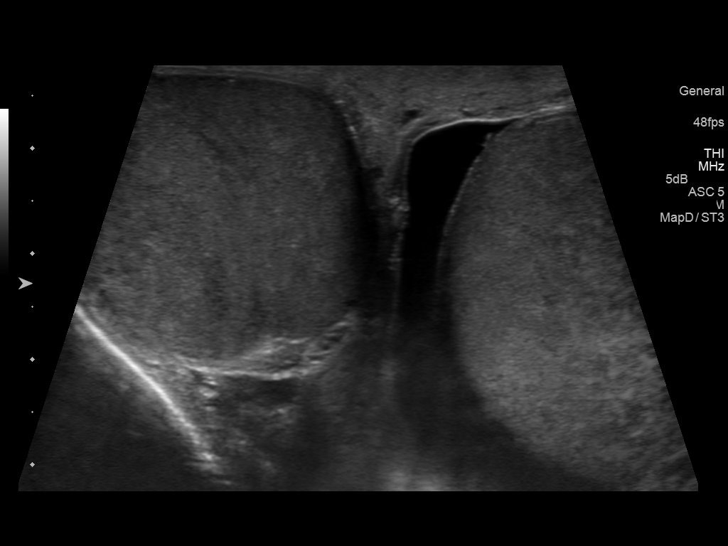
[im 9/101]
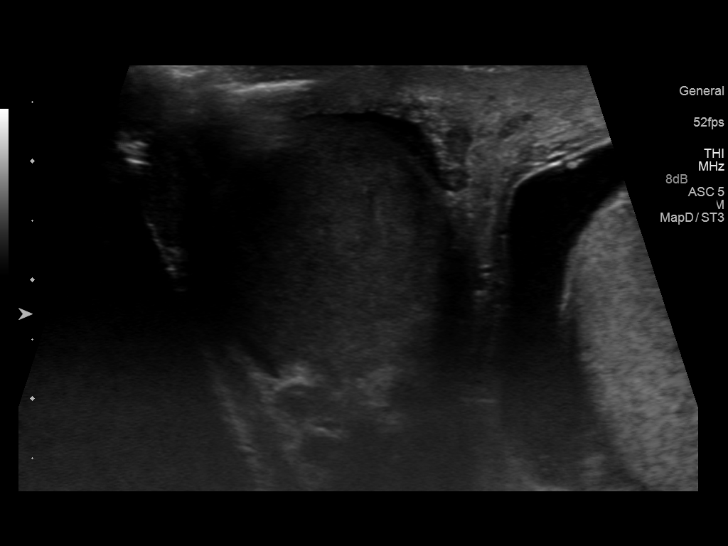
[im 17/101]
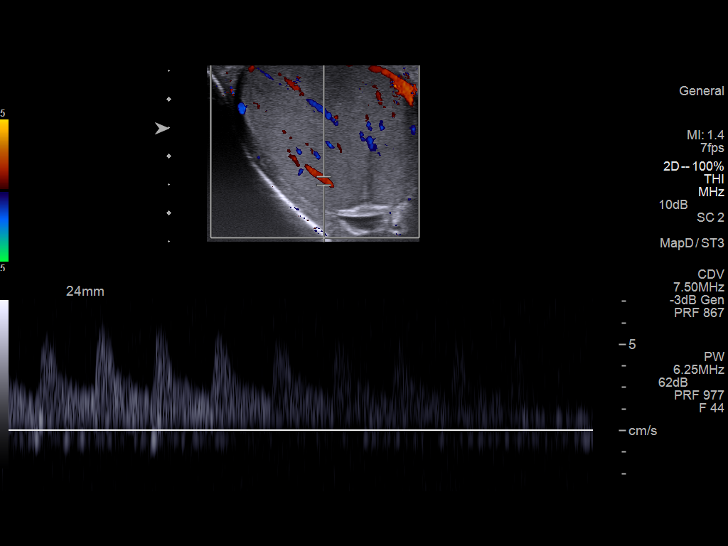
[im 26/101]
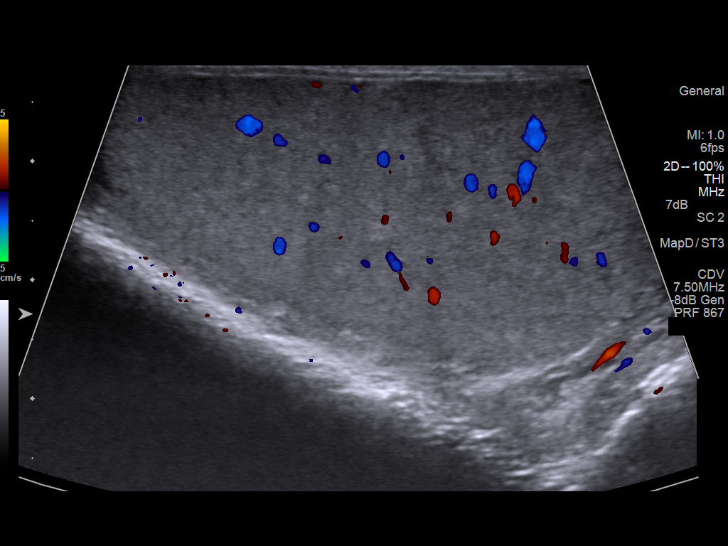
[im 34/101]
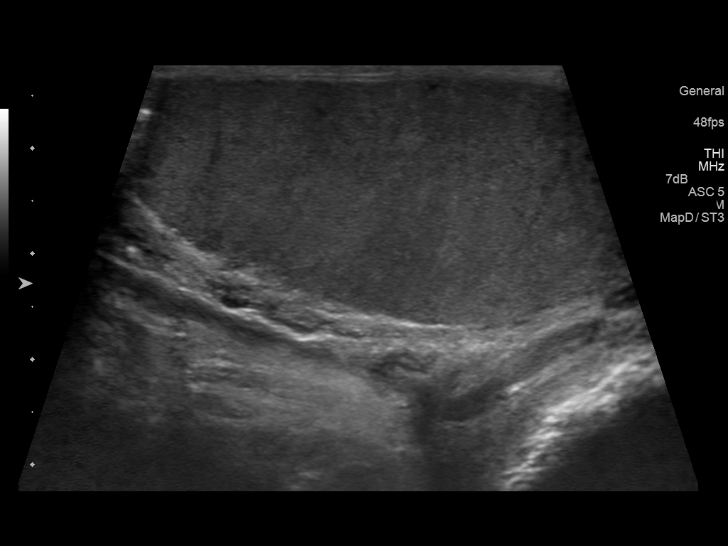
[im 42/101]
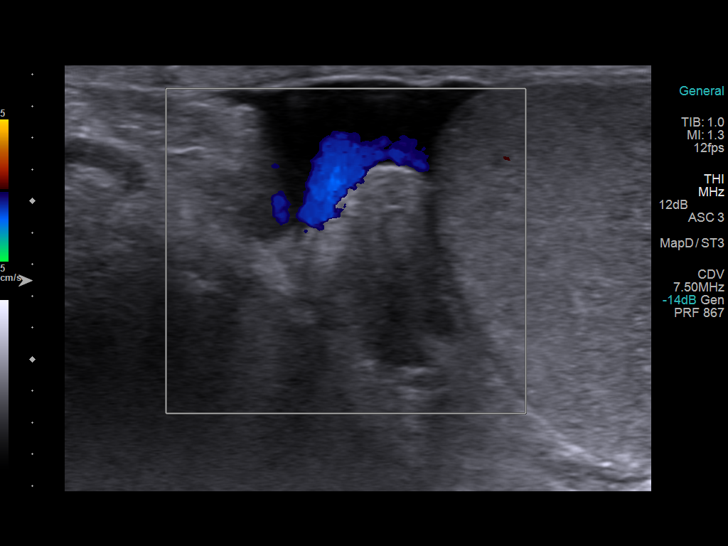
[im 51/101]
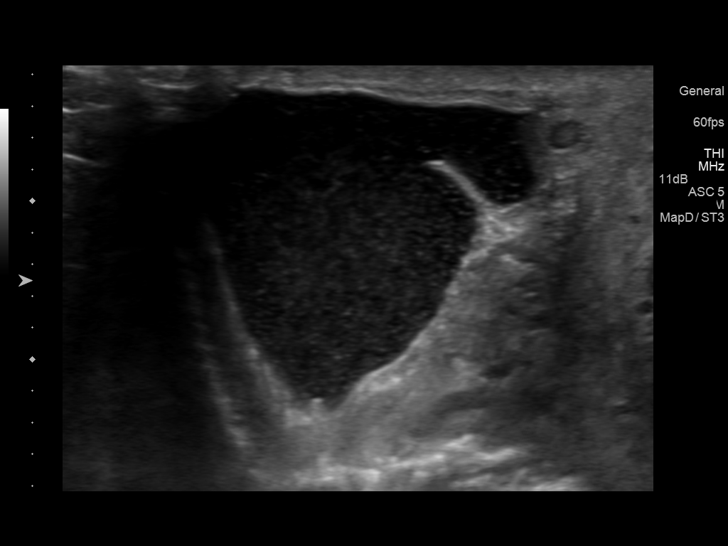
[im 59/101]
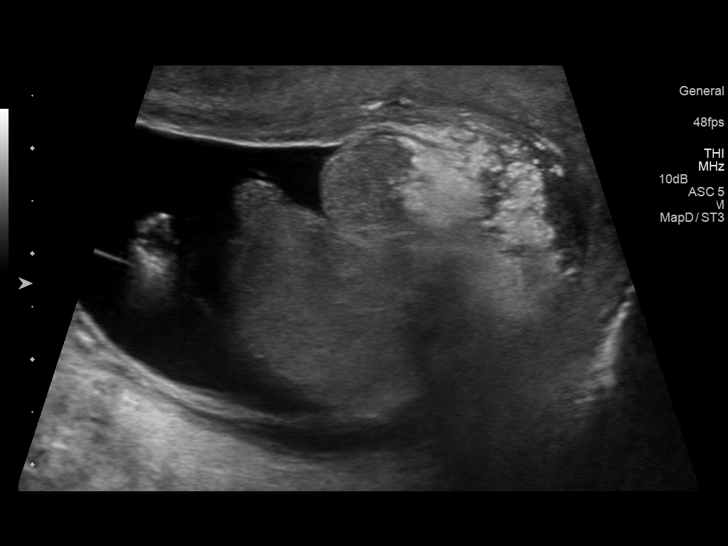
[im 67/101]
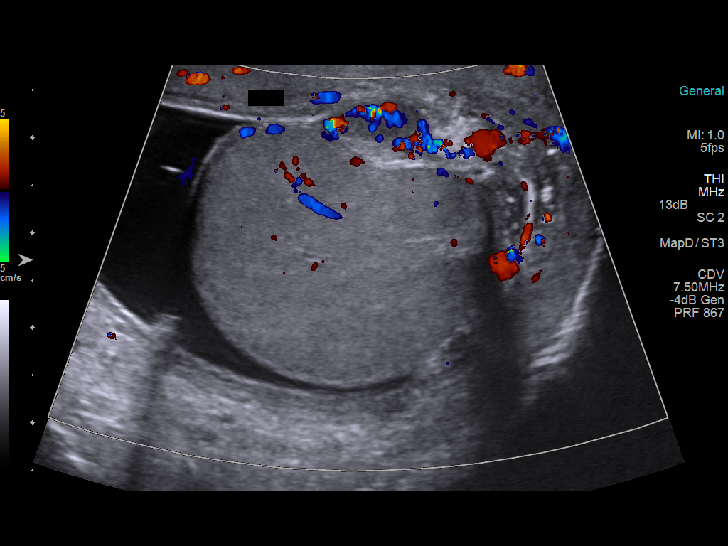
[im 76/101]
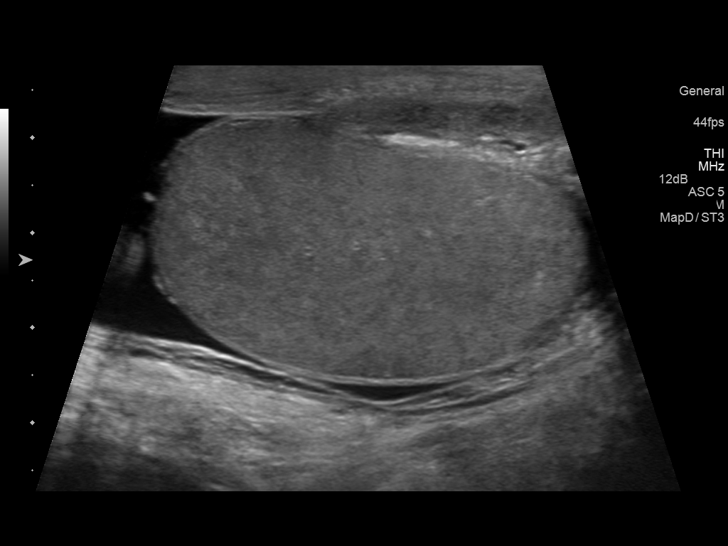
[im 84/101]
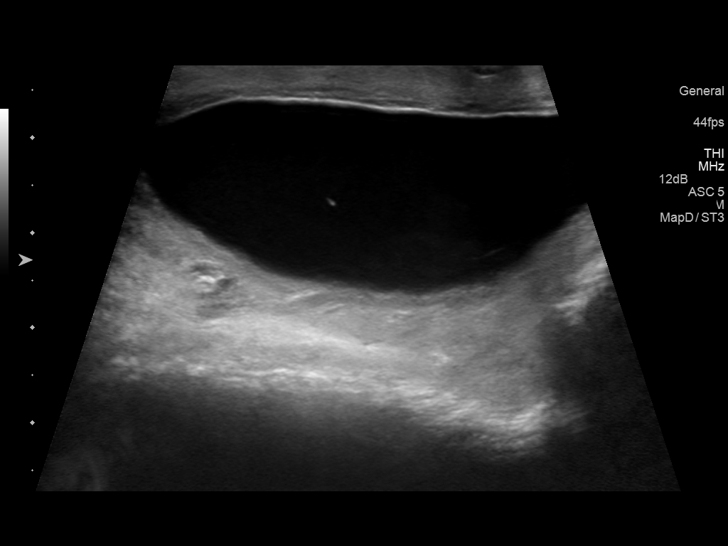
[im 92/101]
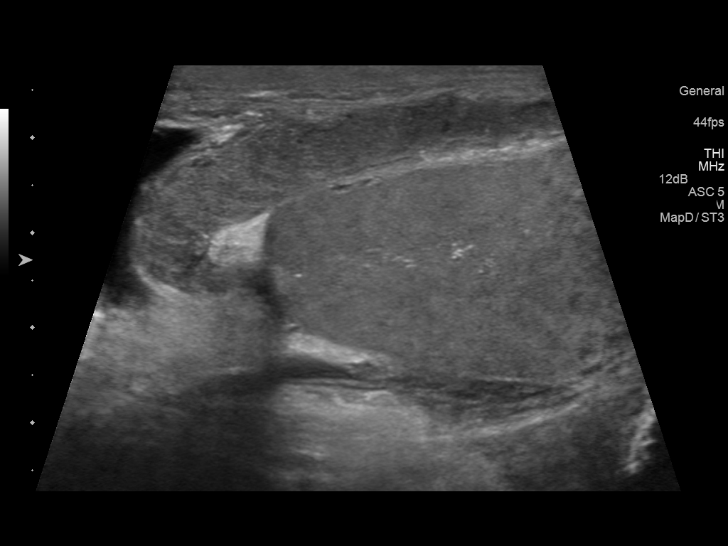
[im 101/101]
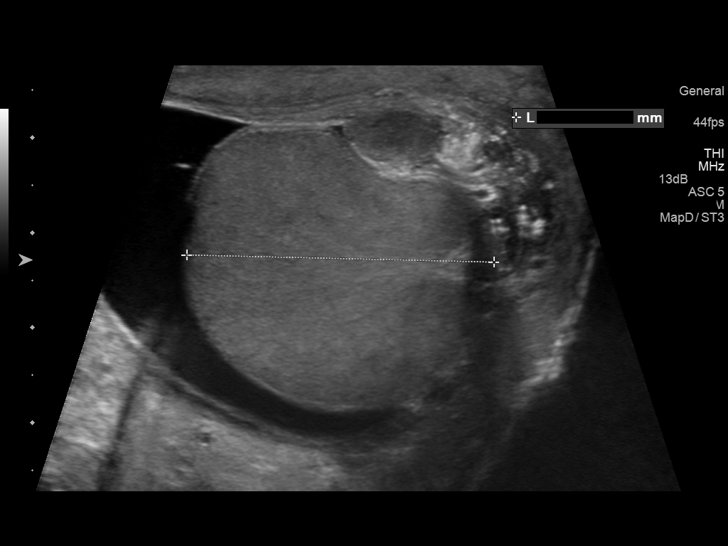

[13 of 25 positions shown; findings below may reference images not displayed]

FINDINGS: Right testicle

Measurements: 5.2 x 2.6 x 3.3 cm. No mass or microlithiasis
visualized.

Left testicle

Measurements: 4.5 x 2.6 x 3.2 cm. No mass or microlithiasis
visualized.

Right epididymis: Normal in size and appearance. Adjacent to the
right testicle is a 1.2 cm isoechoic rounded well-circumscribed
small nodule. This could represent a portion of the epididymis or
small complex cyst such as a epididymal cyst or subcutaneous
sebaceous lesion.

Left epididymis: Left epididymis is prominent and hypervascular
suggesting left epididymitis. Left scrotal edema noted.

Hydrocele:  Bilateral hydroceles, left side greater than right

Varicocele:  None visualized.

Pulsed Doppler interrogation of both testes demonstrates normal low
resistance arterial and venous waveforms bilaterally.
IMPRESSION: 1.  Findings consistent with left epididymitis.

2. Bilateral hydroceles, left side greater than right. No evidence
of testicular torsion or intratesticular mass.

## 2017-01-26 DIAGNOSIS — N390 Urinary tract infection, site not specified: Secondary | ICD-10-CM | POA: Diagnosis not present

## 2017-01-26 DIAGNOSIS — R319 Hematuria, unspecified: Secondary | ICD-10-CM | POA: Diagnosis not present

## 2017-01-26 DIAGNOSIS — R300 Dysuria: Secondary | ICD-10-CM | POA: Diagnosis not present

## 2017-02-12 IMAGING — US US RENAL
1 series · 14 of 25 positions shown · non-contrast
Comparison: None.

CLINICAL DATA: Chronic cystitis.

EXAM:
RENAL / URINARY TRACT ULTRASOUND COMPLETE

[Series 1: us renal · 0.24mm/px · 14 of 43 slices shown]
[im 1/43]
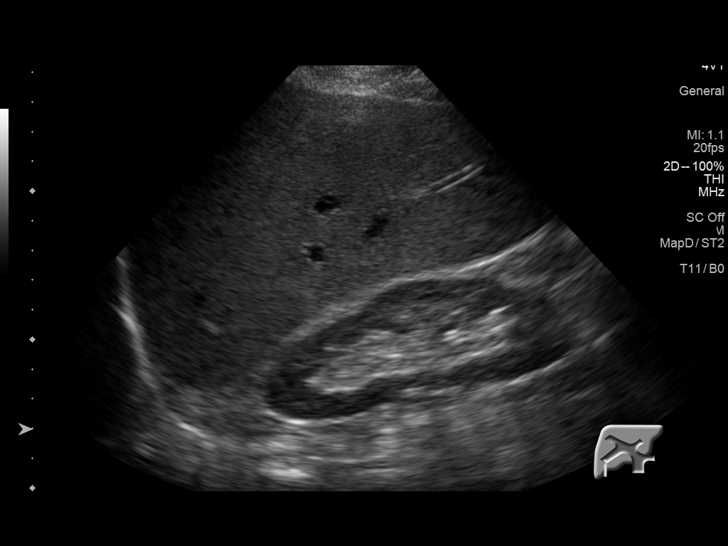
[im 4/43]
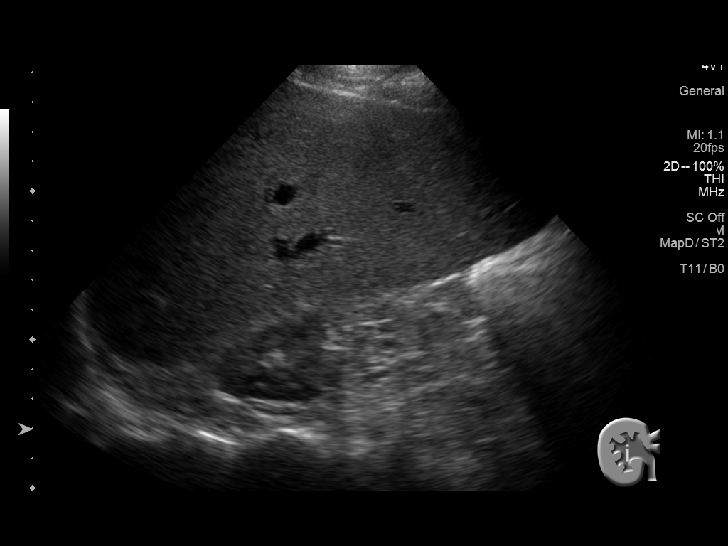
[im 8/43]
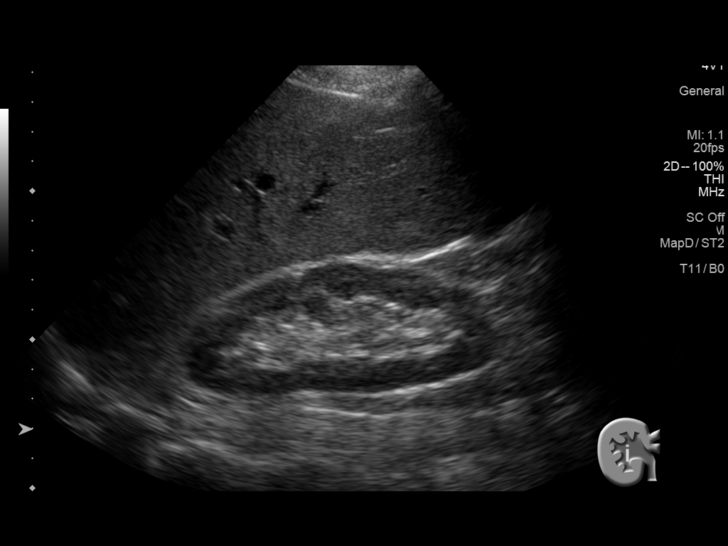
[im 11/43]
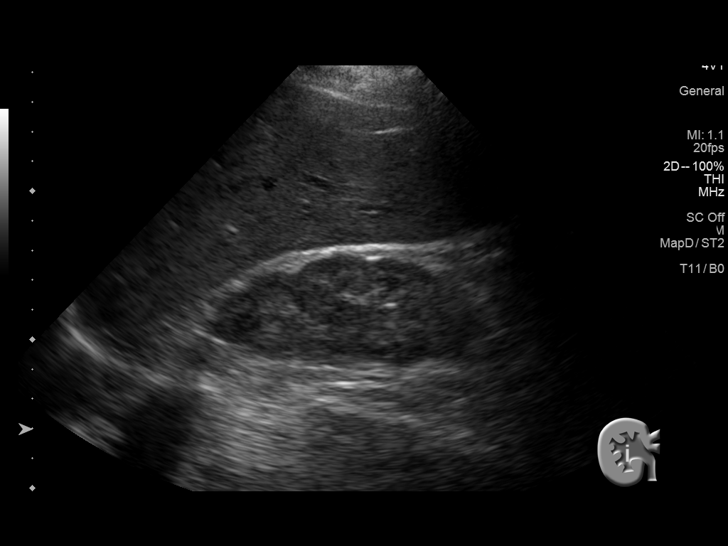
[im 15/43]
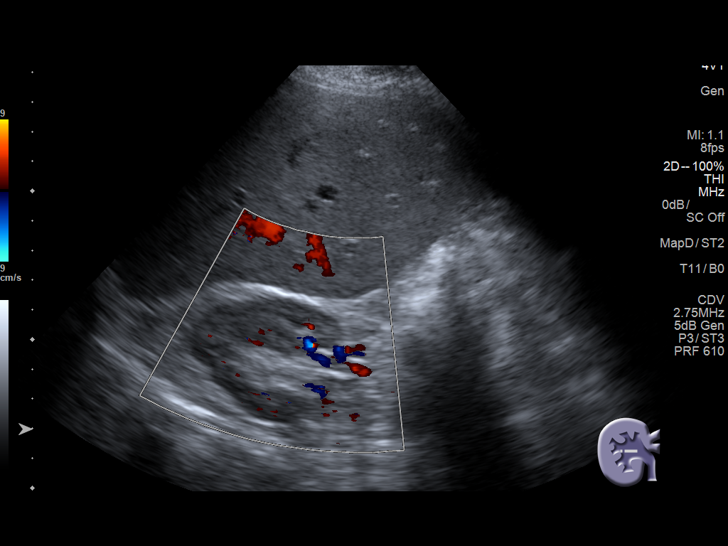
[im 16/43]
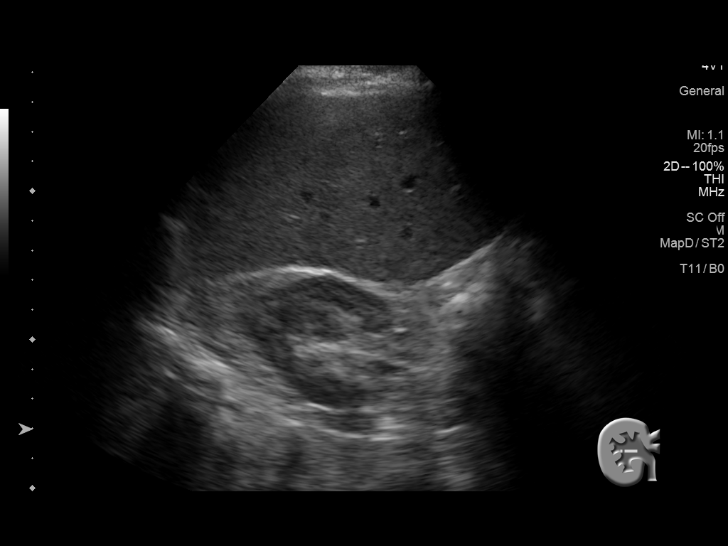
[im 20/43]
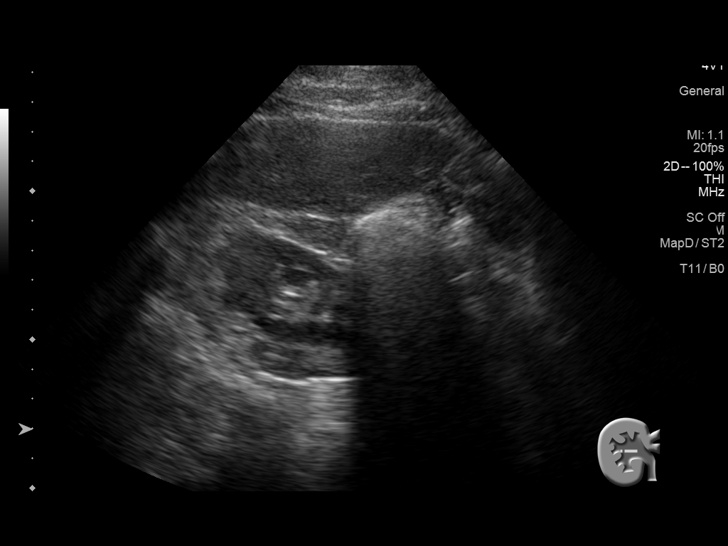
[im 23/43]
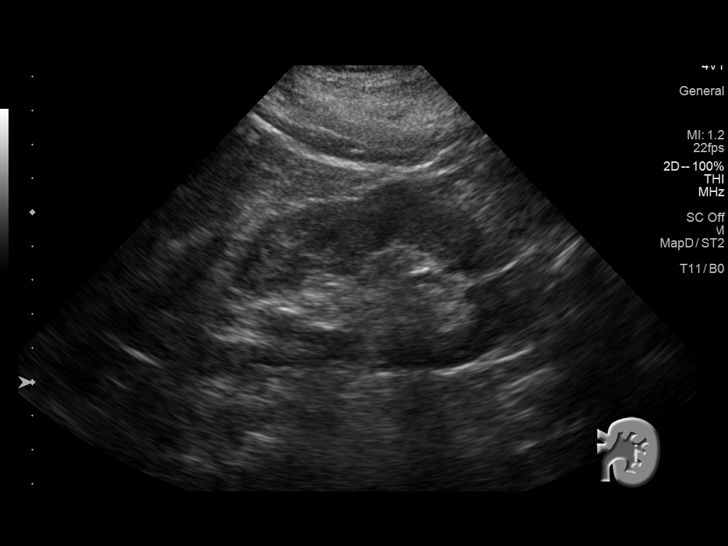
[im 27/43]
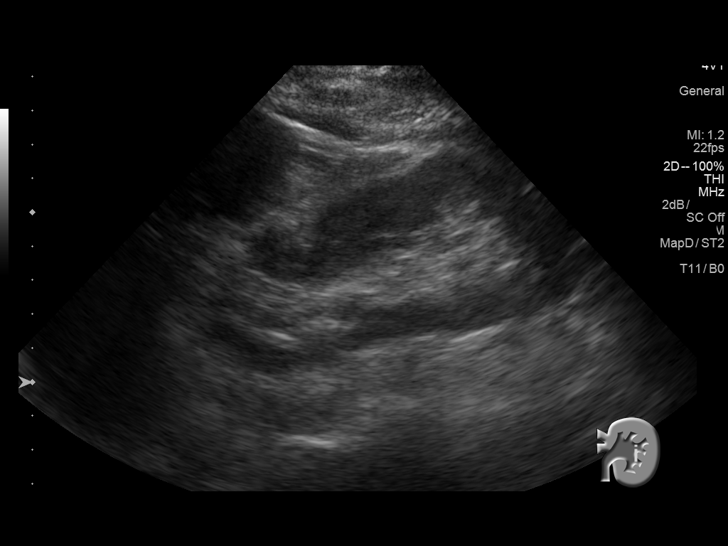
[im 29/43]
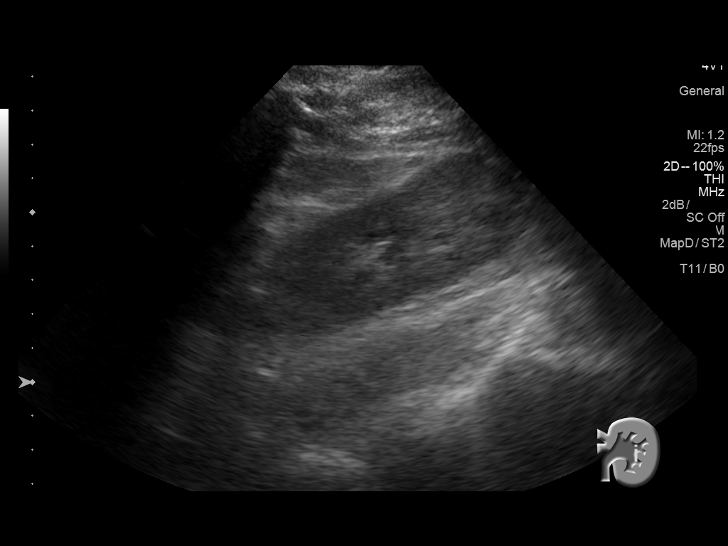
[im 32/43]
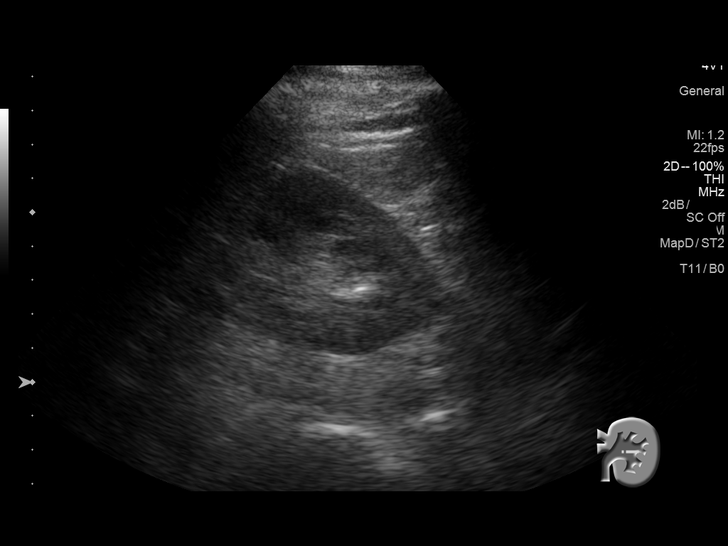
[im 36/43]
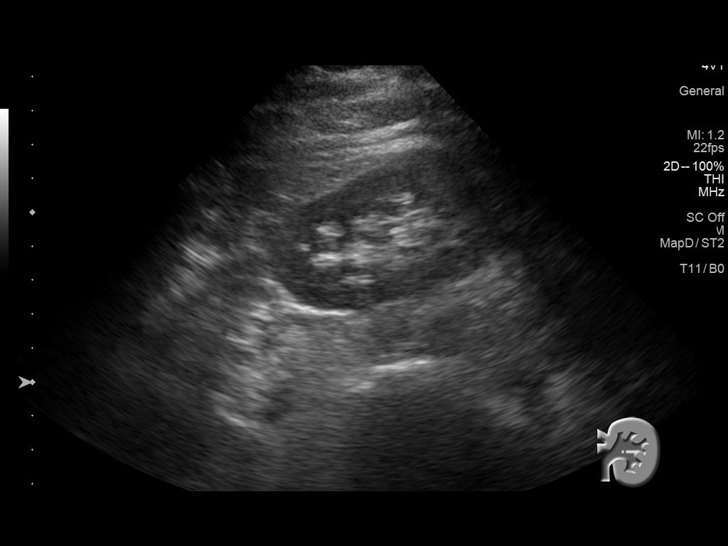
[im 39/43]
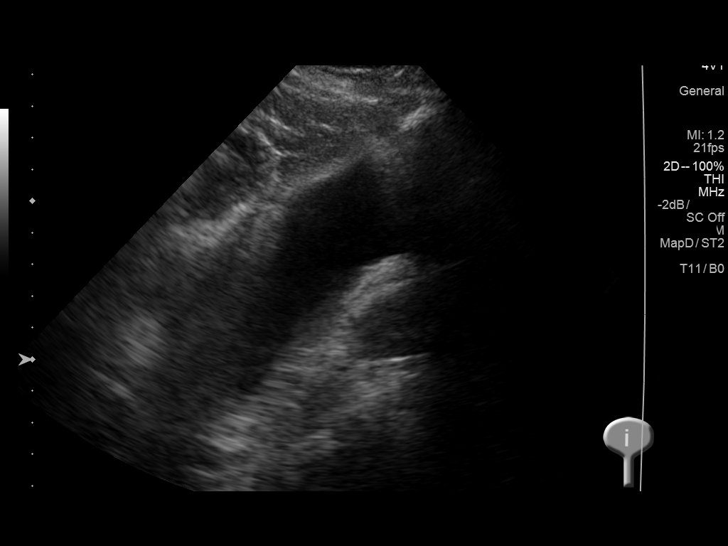
[im 43/43]
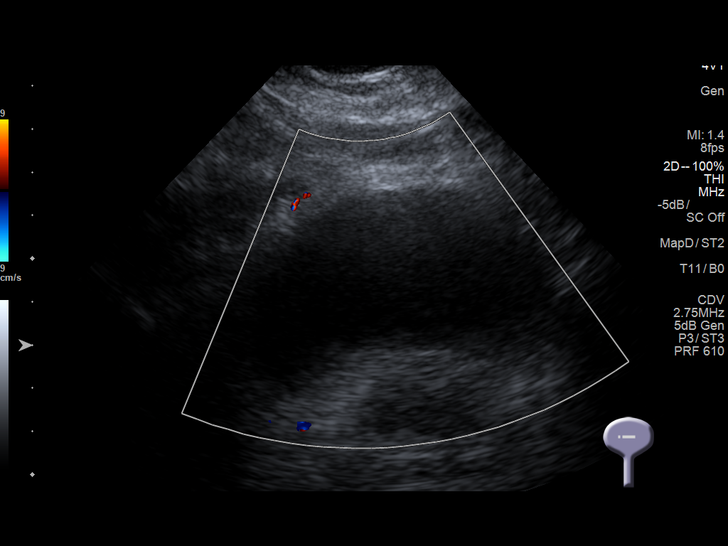

[14 of 25 positions shown; findings below may reference images not displayed]

FINDINGS: Right Kidney:

Length: 10.7 cm. Echogenicity within normal limits. No mass or
hydronephrosis visualized.

Left Kidney:

Length: 12.7 cm. Echogenicity within normal limits. No mass or
hydronephrosis visualized.

Bladder:

Incompletely distended, but appears normal for degree of bladder
distention.
IMPRESSION: Normal appearance of both kidneys.  No evidence of hydronephrosis.

Incompletely distended urinary bladder, but otherwise unremarkable
in appearance.

## 2017-03-05 DIAGNOSIS — H524 Presbyopia: Secondary | ICD-10-CM | POA: Diagnosis not present

## 2017-03-05 DIAGNOSIS — H5211 Myopia, right eye: Secondary | ICD-10-CM | POA: Diagnosis not present

## 2017-03-05 DIAGNOSIS — H52223 Regular astigmatism, bilateral: Secondary | ICD-10-CM | POA: Diagnosis not present

## 2017-10-22 ENCOUNTER — Ambulatory Visit: Payer: BLUE CROSS/BLUE SHIELD | Admitting: Family Medicine

## 2017-10-22 ENCOUNTER — Encounter: Payer: Self-pay | Admitting: Family Medicine

## 2017-10-22 VITALS — BP 134/82 | HR 74 | Temp 97.0°F | Ht 72.0 in | Wt 176.6 lb

## 2017-10-22 DIAGNOSIS — R229 Localized swelling, mass and lump, unspecified: Secondary | ICD-10-CM

## 2017-10-22 DIAGNOSIS — R251 Tremor, unspecified: Secondary | ICD-10-CM | POA: Diagnosis not present

## 2017-10-22 DIAGNOSIS — L570 Actinic keratosis: Secondary | ICD-10-CM

## 2017-10-22 NOTE — Progress Notes (Signed)
   HPI  Patient presents today for tremors and skin nodules.  Tremor in the hands Is been going on for months, approximately 2 months.  It happens mostly at rest, it goes away whenever he does any activities. No problems with walking, no change in expression, no change in strength in hands.  Patient has multiple skin nodules, he states that he has had 5 or so removed from his hand.  He has been on his left eyebrow, one in his right neck that seems to be getting worse.  He also has known multiple scaly skin lesions of the hands that he and his wife are concerned about.  PMH: Smoking status noted ROS: Per HPI  Objective: BP 134/82   Pulse 74   Temp (!) 97 F (36.1 C) (Oral)   Ht 6' (1.829 m)   Wt 176 lb 9.6 oz (80.1 kg)   BMI 23.95 kg/m  Gen: NAD, alert, cooperative with exam HEENT: NCAT CV: RRR, good S1/S2, no murmur Resp: CTABL, no wheezes, non-labored Ext: No edema, warm Neuro: Alert and oriented, fine tremor at rest in bilateral hands Skin Left eyebrow with approximately 8 mm in diameter flesh colored skin nodule, right posterior neck with approximately 2 cm subcutaneous skin nodule 5-10 actinic keratosis bilateral arms  Assessment and plan:  #Tremor of both hands Patient with bilateral tremor at rest, recommended neurologic evaluation Refer to neurology  #Actinic keratosis Multiple on bilateral arms Dermatology  #Skin nodules Patient with multiple skin nodules, recommended dermatology referral He is interested in having his right posterior neck looked at    Orders Placed This Encounter  Procedures  . Ambulatory referral to Dermatology    Referral Priority:   Routine    Referral Type:   Consultation    Referral Reason:   Specialty Services Required    Requested Specialty:   Dermatology    Number of Visits Requested:   1  . Ambulatory referral to Neurology    Referral Priority:   Routine    Referral Type:   Consultation    Referral Reason:   Specialty  Services Required    Requested Specialty:   Neurology    Number of Visits Requested:   1    Murtis Sink, MD [facility] 10/22/2017, 10:03 AM

## 2017-11-18 ENCOUNTER — Ambulatory Visit: Payer: BLUE CROSS/BLUE SHIELD | Admitting: Neurology

## 2017-11-23 ENCOUNTER — Ambulatory Visit: Payer: BLUE CROSS/BLUE SHIELD | Admitting: Neurology

## 2017-11-24 DIAGNOSIS — L720 Epidermal cyst: Secondary | ICD-10-CM | POA: Diagnosis not present

## 2017-11-24 DIAGNOSIS — L570 Actinic keratosis: Secondary | ICD-10-CM | POA: Diagnosis not present

## 2017-11-24 DIAGNOSIS — D229 Melanocytic nevi, unspecified: Secondary | ICD-10-CM | POA: Diagnosis not present

## 2017-12-12 DIAGNOSIS — N132 Hydronephrosis with renal and ureteral calculous obstruction: Secondary | ICD-10-CM | POA: Diagnosis not present

## 2017-12-12 DIAGNOSIS — N200 Calculus of kidney: Secondary | ICD-10-CM | POA: Diagnosis not present

## 2017-12-12 DIAGNOSIS — F17200 Nicotine dependence, unspecified, uncomplicated: Secondary | ICD-10-CM | POA: Diagnosis not present

## 2017-12-12 DIAGNOSIS — M5137 Other intervertebral disc degeneration, lumbosacral region: Secondary | ICD-10-CM | POA: Diagnosis not present

## 2017-12-30 ENCOUNTER — Other Ambulatory Visit: Payer: Self-pay | Admitting: Dermatology

## 2017-12-30 DIAGNOSIS — L723 Sebaceous cyst: Secondary | ICD-10-CM | POA: Diagnosis not present

## 2018-01-10 ENCOUNTER — Encounter: Payer: Self-pay | Admitting: Neurology

## 2018-01-10 ENCOUNTER — Ambulatory Visit: Payer: BLUE CROSS/BLUE SHIELD | Admitting: Neurology

## 2018-01-10 VITALS — BP 118/70 | HR 72 | Ht 72.0 in | Wt 169.0 lb

## 2018-01-10 DIAGNOSIS — R251 Tremor, unspecified: Secondary | ICD-10-CM | POA: Diagnosis not present

## 2018-01-10 NOTE — Patient Instructions (Signed)
You have a rather mild and intermittent tremor of both hands. I do not see any signs or symptoms of parkinson's like disease or what we call parkinsonism.   For your tremor, I would not recommend any new medication for fear of side effects (especially sleepiness) or medication interactions, especially in light of your mild findings. We do not have to make a follow up appointment. I would be happy to see you as needed, your exam looks good.   Please remember, that any kind of tremor may be exacerbated by anxiety, anger, nervousness, excitement, dehydration, sleep deprivation, by caffeine, and low blood sugar values or blood sugar fluctuations.   Please stop smoking.   I do recommend a full physical exam and routine blood work through your primary care, including thyroid function test.

## 2018-01-10 NOTE — Progress Notes (Signed)
Subjective:    Patient ID: Troy Baker is a 52 y.o. male.  HPI     Huston FoleySaima Tyishia Aune, MD, PhD [facility] [address], [address] [address] [place], [place] [address]  Dear Dr. Ermalinda MemosBradshaw,   I saw your patient, Troy Baker, upon your kind request, in my neurologic clinic today for initial consultation of his tremors. The patient is accompanied by his fianc today. As you know, [name] is a 52 year old right-handed gentleman with an underlying benign medical history who reports a bilateral upper extremity tremor for the past few months. I reviewed your office note from 10/22/2017. His tremor is generally mild and intermittent. Tremor is more noticeable by his family and his fiance, has been ongoing for about 6 months, does not actually bother him or interfere with fine motor skills or at work. He is not aware of any family history of tremors or Parkinson's disease.  He lives with his fiance. He has 2 stepchildren. He smokes about one half packs per day, drinks alcohol in the form of beer, one per week on average, caffeine in the form of coffee 2 cups per day in the morning, soda at lunch and tea at dinner typically. He tries to hydrate well with water. He has not noticed any significant triggers but has at times noticed a tremor when someone else pointed out to him. It does not seem to be one-sided. He has not noticed any fine motor difficulties or balance issues, no recent falls are reported. He has occasional tingling in both hands especially in the thumb and first 2 fingers. He believes he has carpal tunnel. He works as a Psychologist, occupationalwelder. He is not currently working on smoking cessation. He does not sleep very long typically, this is not new. He averages about 4-6 hours of sleep on an average night.   His Past Medical History Is Significant For: No past medical history on file.  His Past Surgical History Is Significant For: Past Surgical History:  Procedure Laterality Date   . HERNIA REPAIR Right   . TONSILLECTOMY      His Family History Is Significant For: Family History  Problem Relation Age of Onset  . Diabetes Mother   . Diabetes Maternal Aunt   . Diabetes Maternal Uncle   . Diabetes Maternal Grandmother     His Social History Is Significant For: Social History   Socioeconomic History  . Marital status: Single    Spouse name: Not on file  . Number of children: Not on file  . Years of education: Not on file  . Highest education level: Not on file  Occupational History  . Not on file  Social Needs  . Financial resource strain: Not on file  . Food insecurity:    Worry: Not on file    Inability: Not on file  . Transportation needs:    Medical: Not on file    Non-medical: Not on file  Tobacco Use  . Smoking status: Heavy Tobacco Smoker    Packs/day: 2.00  . Smokeless tobacco: Never Used  Substance and Sexual Activity  . Alcohol use: Yes  . Drug use: No  . Sexual activity: Not on file  Lifestyle  . Physical activity:    Days per week: Not on file    Minutes per session: Not on file  . Stress: Not on file  Relationships  . Social connections:    Talks on phone: Not on file    Gets together: Not  on file    Attends religious service: Not on file    Active member of club or organization: Not on file    Attends meetings of clubs or organizations: Not on file    Relationship status: Not on file  Other Topics Concern  . Not on file  Social History Narrative  . Not on file    His Allergies Are:  No Known Allergies:   His Current Medications Are:  No outpatient encounter medications on file as of 01/10/2018.   No facility-administered encounter medications on file as of 01/10/2018.   : Review of Systems:  Out of a complete 14 point review of systems, all are reviewed and negative with the exception of these symptoms as listed below:  Review of Systems  Neurological:       Pt presents today to discuss his tremors. Pt says that  his tremors come and go. He notices the tremors intermittently in his bilateral hands. The tremors will stop when he notices them. Pt is right handed.    Objective:  Neurological Exam  Physical Exam Physical Examination:   Vitals:   01/10/18 1015  BP: 118/70  Pulse: 72   General Examination: The patient is a very pleasant 52 y.o. male in no acute distress. He appears well-developed and well-nourished and well groomed.   HEENT: Normocephalic, atraumatic, pupils are equal, round and reactive to light and accommodation. Extraocular tracking is good without limitation to gaze excursion or nystagmus noted. Normal smooth pursuit is noted. Hearing is grossly intact. Face is symmetric with normal facial animation and normal facial sensation. Speech is clear with no dysarthria noted. There is no hypophonia. There is no lip, neck/head, jaw or voice tremor. Neck is supple with full range of passive and active motion. There are no carotid bruits on auscultation. Oropharynx exam reveals: mild mouth dryness, adequate dental hygiene and dentures in place. Tongue protrudes centrally and palate elevates symmetrically.   Chest: Clear to auscultation without wheezing, rhonchi or crackles noted.  Heart: S1+S2+0, regular and normal without murmurs, rubs or gallops noted.   Abdomen: Soft, non-tender and non-distended with normal bowel sounds appreciated on auscultation.  Extremities: There is no pitting edema in the distal lower extremities bilaterally.   Skin: Warm and dry without trophic changes noted.  Musculoskeletal: exam reveals no obvious joint deformities, tenderness or joint swelling or erythema.   Neurologically:  Mental status: The patient is awake, alert and oriented in all 4 spheres. His immediate and remote memory, attention, language skills and fund of knowledge are appropriate. There is no evidence of aphasia, agnosia, apraxia or anomia. Speech is clear with normal prosody and enunciation.  Thought process is linear. Mood is normal and affect is normal.  Cranial nerves II - XII are as described above under HEENT exam. In addition: shoulder shrug is normal with equal shoulder height noted. Motor exam: Normal bulk, strength and tone is noted.  On 01/10/2018: Archimedes spiral drawing he has no significant difficulty with his right, dominant hand, slight insecurity but no tremor noted with the left. Handwriting is legible, not tremulous, not micrographic. He has no significant postural or action tremor, no intention tremor. He had a slight intermittent tremor in the right thumb at rest, fast and low-amplitude.  Romberg is negative. Reflexes are 1+ throughout. Fine motor skills and coordination: intact with normal finger taps, normal hand movements, normal rapid alternating patting, normal foot taps and normal foot agility.  Cerebellar testing: No dysmetria or intention tremor. There  is no truncal or gait ataxia.  Sensory exam: intact to light touch.  Gait, station and balance: He stands easily. No veering to one side is noted. No leaning to one side is noted. Posture is age-appropriate and stance is narrow based. Gait shows normal stride length and normal pace. No problems turning are noted.   Assessment and Plan:   In summary, Troy Baker is a very pleasant 52 y.o.-year old male with an underlying benign medical history who presents for consultation of his tremors. He has no significant tremor today with the exception of a slight intermittent tremor in his right thumb. He is history is not telltale for essential tremor, history and exam not telltale for parkinsonism. Clinical observation is recommended. I did not recommend any new testing from my end of things but did encourage him to get a full physical exam and routine blood work done including thyroid function test. His neurological exam is nonfocal otherwise. We talked about tremor triggers today. He is encouraged to stay well hydrated  and well rested and limit his caffeine. He certainly not overdoing his caffeine at this point. He is encouraged to stop smoking. I suggested as needed follow-up. I answered all their questions today and the patient and his fiance were in agreement.  Thank you very much for allowing me to participate in the care of this nice patient. If I can be of any further assistance to you please do not hesitate to call me at [phone].  Sincerely,   Huston Foley, MD, PhD

## 2018-01-13 ENCOUNTER — Other Ambulatory Visit: Payer: Self-pay | Admitting: Physician Assistant

## 2018-01-13 DIAGNOSIS — L720 Epidermal cyst: Secondary | ICD-10-CM | POA: Diagnosis not present

## 2019-03-20 ENCOUNTER — Encounter: Payer: Self-pay | Admitting: Family Medicine

## 2019-03-20 ENCOUNTER — Ambulatory Visit (INDEPENDENT_AMBULATORY_CARE_PROVIDER_SITE_OTHER): Payer: BC Managed Care – PPO | Admitting: Family Medicine

## 2019-03-20 DIAGNOSIS — R8290 Unspecified abnormal findings in urine: Secondary | ICD-10-CM

## 2019-03-20 DIAGNOSIS — R300 Dysuria: Secondary | ICD-10-CM | POA: Diagnosis not present

## 2019-03-20 DIAGNOSIS — K219 Gastro-esophageal reflux disease without esophagitis: Secondary | ICD-10-CM | POA: Insufficient documentation

## 2019-03-20 LAB — URINALYSIS, COMPLETE
Bilirubin, UA: NEGATIVE
Glucose, UA: NEGATIVE
Ketones, UA: NEGATIVE
Leukocytes,UA: NEGATIVE
Nitrite, UA: NEGATIVE
Protein,UA: NEGATIVE
RBC, UA: NEGATIVE
Specific Gravity, UA: 1.02 (ref 1.005–1.030)
Urobilinogen, Ur: 0.2 mg/dL (ref 0.2–1.0)
pH, UA: 6 (ref 5.0–7.5)

## 2019-03-20 LAB — MICROSCOPIC EXAMINATION
Bacteria, UA: NONE SEEN
Epithelial Cells (non renal): NONE SEEN /hpf (ref 0–10)
RBC: NONE SEEN /hpf (ref 0–2)
Renal Epithel, UA: NONE SEEN /hpf
WBC, UA: NONE SEEN /hpf (ref 0–5)

## 2019-03-20 MED ORDER — SULFAMETHOXAZOLE-TRIMETHOPRIM 800-160 MG PO TABS: 1.0000 | ORAL_TABLET | Freq: Two times a day (BID) | ORAL | 0 refills | Status: AC

## 2019-03-20 NOTE — Addendum Note (Signed)
Addended by: Liliane Bade on: 03/20/2019 02:03 PM   Modules accepted: Orders

## 2019-03-20 NOTE — Progress Notes (Signed)
Virtual Visit via telephone Note Due to COVID-19 pandemic this visit was conducted virtually. This visit type was conducted due to national recommendations for restrictions regarding the COVID-19 Pandemic (e.g. social distancing, sheltering in place) in an effort to limit this patient's exposure and mitigate transmission in our community. All issues noted in this document were discussed and addressed.  A physical exam was not performed with this format.   I connected with Troy Baker on 03/20/19 at 1100 by telephone and verified that I am speaking with the correct person using two identifiers. Troy Baker is currently located at home and family is currently with them during visit. The provider, Kari Baars, FNP is located in their office at time of visit.  I discussed the limitations, risks, security and privacy concerns of performing an evaluation and management service by telephone and the availability of in person appointments. I also discussed with the patient that there may be a patient responsible charge related to this service. The patient expressed understanding and agreed to proceed.  Subjective:  Patient ID: Troy Baker, male    DOB: [birth year], 53 y.o.   MRN: [id number]  Chief Complaint:  Dysuria   HPI: Troy Baker is a 53 y.o. male presenting on 03/20/2019 for Dysuria   Pt reports dysuria and malodorous urine for 4-5 days. States he has had this in the past and had an extensive work up that cost him a lot of money. States he ended up only needing antibiotics. Pt states his symptoms are very similar to this. States he has dysuria with slight decrease in urine flow, dark colored urine, and malodorous urine. No testicular swelling or color changes. No fever, chills, weakness, confusion, or flank pain. States he does have tenderness in his left groin when voiding. No urinary retention, rectal pain, or rectal pressure. No penile pain, discharge, or swelling. No new  sexual partners. No recent unprotected coitus.   Dysuria  This is a new problem. The current episode started in the past 7 days. The problem occurs every urination. The problem has been unchanged. The quality of the pain is described as burning and stabbing. The pain is at a severity of 4/10. The pain is mild. There has been no fever. He is sexually active. There is no history of pyelonephritis. Associated symptoms include hesitancy. Pertinent negatives include no chills, discharge, flank pain, frequency, hematuria, nausea, possible pregnancy, sweats, urgency or vomiting. He has tried nothing for the symptoms.     Relevant past medical, surgical, family, and social history reviewed and updated as indicated.  Allergies and medications reviewed and updated.   History reviewed. No pertinent past medical history.  Past Surgical History:  Procedure Laterality Date  . HERNIA REPAIR Right   . TONSILLECTOMY      Social History   Socioeconomic History  . Marital status: Single    Spouse name: Not on file  . Number of children: Not on file  . Years of education: Not on file  . Highest education level: Not on file  Occupational History  . Not on file  Social Needs  . Financial resource strain: Not on file  . Food insecurity    Worry: Not on file    Inability: Not on file  . Transportation needs    Medical: Not on file    Non-medical: Not on file  Tobacco Use  . Smoking status: Heavy Tobacco Smoker    Packs/day: 2.00  . Smokeless tobacco: Never Used  Substance and Sexual Activity  . Alcohol use: Yes  . Drug use: No  . Sexual activity: Not on file  Lifestyle  . Physical activity    Days per week: Not on file    Minutes per session: Not on file  . Stress: Not on file  Relationships  . Social Herbalist on phone: Not on file    Gets together: Not on file    Attends religious service: Not on file    Active member of club or organization: Not on file    Attends  meetings of clubs or organizations: Not on file    Relationship status: Not on file  . Intimate partner violence    Fear of current or ex partner: Not on file    Emotionally abused: Not on file    Physically abused: Not on file    Forced sexual activity: Not on file  Other Topics Concern  . Not on file  Social History Narrative  . Not on file    Outpatient Encounter Medications as of 03/20/2019  Medication Sig  . sulfamethoxazole-trimethoprim (BACTRIM DS) 800-160 MG tablet Take 1 tablet by mouth 2 (two) times daily for 10 days.   No facility-administered encounter medications on file as of 03/20/2019.     No Known Allergies  Review of Systems  Constitutional: Negative for activity change, appetite change, chills, diaphoresis, fatigue, fever and unexpected weight change.  HENT: Negative.   Eyes: Negative.   Respiratory: Negative for cough, chest tightness and shortness of breath.   Cardiovascular: Negative for chest pain, palpitations and leg swelling.  Gastrointestinal: Positive for abdominal pain. Negative for abdominal distention, anal bleeding, blood in stool, constipation, diarrhea, nausea, rectal pain and vomiting.  Endocrine: Negative.   Genitourinary: Positive for difficulty urinating, dysuria and hesitancy. Negative for decreased urine volume, discharge, enuresis, flank pain, frequency, genital sores, hematuria, penile pain, penile swelling, scrotal swelling, testicular pain and urgency.  Musculoskeletal: Negative for arthralgias, back pain and myalgias.  Skin: Negative.  Negative for color change and pallor.  Allergic/Immunologic: Negative.   Neurological: Negative for dizziness, weakness and headaches.  Hematological: Negative.   Psychiatric/Behavioral: Negative for confusion, hallucinations, sleep disturbance and suicidal ideas.  All other systems reviewed and are negative.        Observations/Objective: No vital signs or physical exam, this was a telephone or  virtual health encounter.  Pt alert and oriented, answers all questions appropriately, and able to speak in full sentences.    Assessment and Plan: Troy Baker was seen today for dysuria.  Diagnoses and all orders for this visit:  Dysuria Malodorous urine Reported symptoms concerning for acute UTI. Differential diagnoses include but are not limited to epididymitis, prostatitis, STI, or renal calculi. Pt agrees to provide urine sample for testing but declines additional testing until urine results. Will empirically treat with bactrim as prescribed. Discussed in detail symptoms that require emergent evaluation and treatment. Pt verbalized understanding. Medications as prescribed. Pt to provide urine sample. Will change therapy if warranted.  -     Chlamydia/Gonococcus/Trichomonas, NAA; Future -     Urine Culture; Future -     Urinalysis, Complete; Future -     sulfamethoxazole-trimethoprim (BACTRIM DS) 800-160 MG tablet; Take 1 tablet by mouth 2 (two) times daily for 10 days.     Follow Up Instructions: Return in about 2 weeks (around 04/03/2019), or if symptoms worsen or fail to improve, for UTI follow up.    I discussed the assessment  and treatment plan with the patient. The patient was provided an opportunity to ask questions and all were answered. The patient agreed with the plan and demonstrated an understanding of the instructions.   The patient was advised to call back or seek an in-person evaluation if the symptoms worsen or if the condition fails to improve as anticipated.  The above assessment and management plan was discussed with the patient. The patient verbalized understanding of and has agreed to the management plan. Patient is aware to call the clinic if they develop any new symptoms or if symptoms persist or worsen. Patient is aware when to return to the clinic for a follow-up visit. Patient educated on when it is appropriate to go to the emergency department.    I provided  15 minutes of non-face-to-face time during this encounter. The call started at 1100. The call ended at 1115. The other time was used for coordination of care.    Kari BaarsMichelle Kalii Chesmore, FNP-C [facility] [address] [place], [place] [id number] [phone] 03/20/19

## 2019-03-21 ENCOUNTER — Telehealth: Payer: Self-pay | Admitting: Family Medicine

## 2019-03-21 ENCOUNTER — Other Ambulatory Visit: Payer: Self-pay | Admitting: *Deleted

## 2019-03-21 DIAGNOSIS — R300 Dysuria: Secondary | ICD-10-CM

## 2019-03-21 LAB — CHLAMYDIA/GONOCOCCUS/TRICHOMONAS, NAA
Chlamydia by NAA: NEGATIVE
Gonococcus by NAA: NEGATIVE
Trich vag by NAA: NEGATIVE

## 2019-03-21 LAB — URINE CULTURE: Organism ID, Bacteria: NO GROWTH

## 2019-03-21 NOTE — Telephone Encounter (Signed)
Aware of results and CT ordered

## 2019-03-22 NOTE — Addendum Note (Signed)
Addended by: Baruch Gouty on: 03/22/2019 08:07 AM   Modules accepted: Orders

## 2019-04-07 ENCOUNTER — Ambulatory Visit (INDEPENDENT_AMBULATORY_CARE_PROVIDER_SITE_OTHER): Payer: Self-pay | Admitting: Family Medicine

## 2019-04-07 ENCOUNTER — Ambulatory Visit: Payer: BC Managed Care – PPO | Admitting: Family Medicine

## 2019-04-07 ENCOUNTER — Encounter: Payer: Self-pay | Admitting: Family Medicine

## 2019-04-07 DIAGNOSIS — Z720 Tobacco use: Secondary | ICD-10-CM

## 2019-04-07 DIAGNOSIS — Z716 Tobacco abuse counseling: Secondary | ICD-10-CM

## 2019-04-07 DIAGNOSIS — R1030 Lower abdominal pain, unspecified: Secondary | ICD-10-CM

## 2019-04-07 DIAGNOSIS — R918 Other nonspecific abnormal finding of lung field: Secondary | ICD-10-CM

## 2019-04-07 DIAGNOSIS — N200 Calculus of kidney: Secondary | ICD-10-CM

## 2019-04-07 DIAGNOSIS — R911 Solitary pulmonary nodule: Secondary | ICD-10-CM | POA: Insufficient documentation

## 2019-04-07 MED ORDER — VARENICLINE TARTRATE 1 MG PO TABS: 1.0000 mg | ORAL_TABLET | Freq: Two times a day (BID) | ORAL | 2 refills | Status: AC

## 2019-04-07 MED ORDER — CHANTIX STARTING MONTH PAK 0.5 MG X 11 & 1 MG X 42 PO TABS: ORAL_TABLET | ORAL | 0 refills | Status: DC

## 2019-04-07 NOTE — Progress Notes (Signed)
Virtual Visit via telephone Note Due to COVID-19 pandemic this visit was conducted virtually. This visit type was conducted due to national recommendations for restrictions regarding the COVID-19 Pandemic (e.g. social distancing, sheltering in place) in an effort to limit this patient's exposure and mitigate transmission in our community. All issues noted in this document were discussed and addressed.  A physical exam was not performed with this format.   I connected with Troy Baker on 04/07/19 at 0815 by telephone and verified that I am speaking with the correct person using two identifiers. Troy Baker is currently located at home and family is currently with them during visit. The provider, Kari Baars, FNP is located in their office at time of visit.  I discussed the limitations, risks, security and privacy concerns of performing an evaluation and management service by telephone and the availability of in person appointments. I also discussed with the patient that there may be a patient responsible charge related to this service. The patient expressed understanding and agreed to proceed.  Subjective:  Patient ID: Troy Baker, male    DOB: 1965/07/06, 53 y.o.   MRN: [id number]  Chief Complaint:  Abdominal Pain and Nicotine Dependence   HPI: Troy Baker is a 53 y.o. male presenting on 04/07/2019 for Abdominal Pain and Nicotine Dependence   Pt following up today after treatment for questionable cystitis, prostatitis, or epididymitis. Pt states he continues to have intermittent lower abdomina pain and pressure. He reports this feels similar to when he had renal stones in the past. States the pain will radiate from abdomen into genitals. No hematuria, dysuria, anuria, penile discharge, or genital swelling.   Pt states he is having issues trying to quit smoking. Pt with 88 pack year history. Pt states at one point he was down to 1 PPD but this did not last long. States he  was told when he had his previous kidney stone he was told he had a nodule on his lung that needed to be followed. He has not followed up pertaining to this. Last CT of chest over a year ago. Pt states he does have a "smokers' cough". No shortness of breath, chest pain, weight loss, fever, chills, or diaphoresis. No sputum production.   Abdominal Pain This is a recurrent problem. The current episode started 1 to 4 weeks ago. The onset quality is undetermined. The problem occurs intermittently. The problem has been waxing and waning. The pain is located in the periumbilical region, suprapubic region, RLQ and LLQ. The pain is at a severity of 4/10. The pain is mild. The quality of the pain is aching, sharp and cramping. The abdominal pain radiates to the scrotum. Associated symptoms include frequency. Pertinent negatives include no anorexia, arthralgias, belching, constipation, diarrhea, dysuria, fever, flatus, headaches, hematochezia, hematuria, melena, myalgias, nausea, vomiting or weight loss. Nothing aggravates the pain. The pain is relieved by nothing. He has tried antibiotics for the symptoms. The treatment provided moderate relief.  Nicotine Dependence Presents for initial visit. Symptoms are negative for fatigue. Preferred tobacco types include cigarettes. Preferred cigarette types include filtered. Preferred strength is regular. His urge triggers include company of smokers, contact with substance, decrease in perceived risk, driving, meal time and stress. He smokes 2 packs of cigarettes per day. He started smoking when he was >70 years old. Past treatments include varenicline. The treatment provided moderate relief. Compliance with prior treatments has been poor. Troy Baker is ready to quit. Troy Baker has tried to quit 1  time.     Relevant past medical, surgical, family, and social history reviewed and updated as indicated.  Allergies and medications reviewed and updated.   History reviewed. No  pertinent past medical history.  Past Surgical History:  Procedure Laterality Date  . HERNIA REPAIR Right   . TONSILLECTOMY      Social History   Socioeconomic History  . Marital status: Single    Spouse name: Not on file  . Number of children: Not on file  . Years of education: Not on file  . Highest education level: Not on file  Occupational History  . Not on file  Social Needs  . Financial resource strain: Not on file  . Food insecurity    Worry: Not on file    Inability: Not on file  . Transportation needs    Medical: Not on file    Non-medical: Not on file  Tobacco Use  . Smoking status: Heavy Tobacco Smoker    Packs/day: 2.00    Years: 44.00    Pack years: 88.00  . Smokeless tobacco: Never Used  Substance and Sexual Activity  . Alcohol use: Yes  . Drug use: No  . Sexual activity: Yes  Lifestyle  . Physical activity    Days per week: Not on file    Minutes per session: Not on file  . Stress: Not on file  Relationships  . Social Musicianconnections    Talks on phone: Not on file    Gets together: Not on file    Attends religious service: Not on file    Active member of club or organization: Not on file    Attends meetings of clubs or organizations: Not on file    Relationship status: Not on file  . Intimate partner violence    Fear of current or ex partner: Not on file    Emotionally abused: Not on file    Physically abused: Not on file    Forced sexual activity: Not on file  Other Topics Concern  . Not on file  Social History Narrative  . Not on file    Outpatient Encounter Medications as of 04/07/2019  Medication Sig  . varenicline (CHANTIX CONTINUING MONTH PAK) 1 MG tablet Take 1 tablet (1 mg total) by mouth 2 (two) times daily.  . varenicline (CHANTIX STARTING MONTH PAK) 0.5 MG X 11 & 1 MG X 42 tablet Take one 0.5 mg tablet by mouth once daily for 3 days, then increase to one 0.5 mg tablet twice daily for 4 days, then increase to one 1 mg tablet twice  daily.   No facility-administered encounter medications on file as of 04/07/2019.     No Known Allergies  Review of Systems  Constitutional: Negative for activity change, appetite change, chills, diaphoresis, fatigue, fever, unexpected weight change and weight loss.  HENT: Negative.   Eyes: Negative.  Negative for photophobia and visual disturbance.  Respiratory: Positive for cough. Negative for chest tightness, shortness of breath and wheezing.   Cardiovascular: Negative for chest pain, palpitations and leg swelling.  Gastrointestinal: Positive for abdominal pain. Negative for abdominal distention, anal bleeding, anorexia, blood in stool, constipation, diarrhea, flatus, hematochezia, melena, nausea, rectal pain and vomiting.  Endocrine: Negative.   Genitourinary: Positive for flank pain, frequency and testicular pain. Negative for decreased urine volume, difficulty urinating, discharge, dysuria, enuresis, genital sores, hematuria, penile pain, penile swelling, scrotal swelling and urgency.  Musculoskeletal: Negative for arthralgias and myalgias.  Skin: Negative.  Negative for color  change and pallor.  Allergic/Immunologic: Negative.   Neurological: Negative for dizziness, weakness, light-headedness and headaches.  Hematological: Negative.   Psychiatric/Behavioral: Negative for confusion, hallucinations, sleep disturbance and suicidal ideas.  All other systems reviewed and are negative.        Observations/Objective: No vital signs or physical exam, this was a telephone or virtual health encounter.  Pt alert and oriented, answers all questions appropriately, and able to speak in full sentences.    Assessment and Plan: Troy Baker was seen today for abdominal pain and nicotine dependence.  Diagnoses and all orders for this visit:  Lower abdominal pain Renal stones Continued abdominal pain and groin pain despite completion of antibiotic therapy. Concerning for renal stones. Will  order CT today. Pt aware to follow up with urology as discussed. Report any new, worsening, or persistent symptoms. Pt aware of symptoms that require emergent evaluation and treatment.  -     CT Abdomen Pelvis Wo Contrast; Future  Lung nodule Previously noted on imaging. Has not had follow up scan as needed. Will order today.  -     CT CHEST NODULE FOLLOW UP LOW DOSE W/O; Future  Tobacco abuse Encounter for smoking cessation counseling Longstanding tobacco abuse history, 88 pack year history. Has tried to quit in the past with Chantix and had some success but did not follow through with therapy. Pt verbalizes readiness to quit today and eager to start therapy. Pt provided number to help line. Discussed proper use of Chantix and importance of continued therapy for at least 6 months post cessation for continue success in smoking cessation. Medication as prescribed. Follow up in 2 months or sooner if needed.  -     CT CHEST NODULE FOLLOW UP LOW DOSE W/O; Future -     varenicline (CHANTIX STARTING MONTH PAK) 0.5 MG X 11 & 1 MG X 42 tablet; Take one 0.5 mg tablet by mouth once daily for 3 days, then increase to one 0.5 mg tablet twice daily for 4 days, then increase to one 1 mg tablet twice daily. -     varenicline (CHANTIX CONTINUING MONTH PAK) 1 MG tablet; Take 1 tablet (1 mg total) by mouth 2 (two) times daily.  Pt aware he needs CPE. Pt will schedule.   Follow Up Instructions: Return in about 2 months (around 06/07/2019), or if symptoms worsen or fail to improve, for smoking cessation, CPE.    I discussed the assessment and treatment plan with the patient. The patient was provided an opportunity to ask questions and all were answered. The patient agreed with the plan and demonstrated an understanding of the instructions.   The patient was advised to call back or seek an in-person evaluation if the symptoms worsen or if the condition fails to improve as anticipated.  The above assessment and  management plan was discussed with the patient. The patient verbalized understanding of and has agreed to the management plan. Patient is aware to call the clinic if they develop any new symptoms or if symptoms persist or worsen. Patient is aware when to return to the clinic for a follow-up visit. Patient educated on when it is appropriate to go to the emergency department.    I provided 25 minutes of non-face-to-face time during this encounter. The call started at 0815. The call ended at [place]. The other time was used for coordination of care.    Monia Pouch, FNP-C [facility] [address] [place], [place] [address] [phone] 04/07/19

## 2019-04-10 ENCOUNTER — Other Ambulatory Visit: Payer: Self-pay | Admitting: Family Medicine

## 2019-04-10 DIAGNOSIS — R911 Solitary pulmonary nodule: Secondary | ICD-10-CM

## 2019-05-03 ENCOUNTER — Ambulatory Visit (HOSPITAL_COMMUNITY): Payer: BLUE CROSS/BLUE SHIELD

## 2019-09-03 DIAGNOSIS — Z20828 Contact with and (suspected) exposure to other viral communicable diseases: Secondary | ICD-10-CM | POA: Diagnosis not present

## 2021-01-13 ENCOUNTER — Encounter: Payer: Self-pay | Admitting: Nurse Practitioner

## 2021-01-13 ENCOUNTER — Other Ambulatory Visit: Payer: Self-pay

## 2021-01-13 ENCOUNTER — Ambulatory Visit (INDEPENDENT_AMBULATORY_CARE_PROVIDER_SITE_OTHER): Payer: BC Managed Care – PPO

## 2021-01-13 ENCOUNTER — Ambulatory Visit: Payer: BC Managed Care – PPO | Admitting: Nurse Practitioner

## 2021-01-13 VITALS — BP 144/89 | HR 73 | Temp 98.0°F | Resp 20 | Ht 72.0 in | Wt 176.0 lb

## 2021-01-13 DIAGNOSIS — K5901 Slow transit constipation: Secondary | ICD-10-CM | POA: Diagnosis not present

## 2021-01-13 DIAGNOSIS — R1013 Epigastric pain: Secondary | ICD-10-CM

## 2021-01-13 DIAGNOSIS — R109 Unspecified abdominal pain: Secondary | ICD-10-CM | POA: Diagnosis not present

## 2021-01-13 NOTE — Progress Notes (Signed)
   Subjective:    Patient ID: Troy Baker, male    DOB: [birth year], 55 y.o.   MRN: [id number]   Chief Complaint: Nausea (Feels like food gets stuck when he eats//) and Abdominal Pain   HPI Patient come sin today accompanied by his wife. He says that last week he started feeling blotted. On Friday he drank a liquid laxative and had 2 bowel movements. It did not help with how he was feeling. Now after he eats he feels blotted and has upper abdominal pain. Is having minimal pain right now.    Review of Systems  Constitutional:  Negative for diaphoresis.  Eyes:  Negative for pain.  Respiratory:  Negative for shortness of breath.   Cardiovascular:  Negative for chest pain, palpitations and leg swelling.  Gastrointestinal:  Negative for abdominal pain.  Endocrine: Negative for polydipsia.  Skin:  Negative for rash.  Neurological:  Negative for dizziness, weakness and headaches.  Hematological:  Does not bruise/bleed easily.  All other systems reviewed and are negative.     Objective:   Physical Exam Vitals and nursing note reviewed.  Constitutional:      Appearance: He is well-developed.  Cardiovascular:     Rate and Rhythm: Normal rate and regular rhythm.  Pulmonary:     Effort: Pulmonary effort is normal.     Breath sounds: Normal breath sounds.  Abdominal:     General: Bowel sounds are increased.     Palpations: Abdomen is soft.     Tenderness: There is abdominal tenderness in the epigastric area. There is no guarding. Negative signs include Murphy's sign and McBurney's sign.  Skin:    General: Skin is warm.  Neurological:     General: No focal deficit present.     Mental Status: He is alert.  Psychiatric:        Mood and Affect: Mood normal.        Behavior: Behavior normal.   BP (!) 144/89   Pulse 73   Temp 98 F (36.7 C) (Temporal)   Resp 20   Ht 6' (1.829 m)   Wt 176 lb (79.8 kg)   SpO2 98%   BMI 23.87 kg/m    KUB- large stool burden in ascending  colon.-Preliminary reading by Paulene Floor, FNP  [facility]      Assessment & Plan:   Troy Baker in today with chief complaint of Nausea (Feels like food gets stuck when he eats//) and Abdominal Pain   1. Epigastric pain - DG Abd 1 View  2. Slow transit constipation Milk of magnesia and 6oz of prune juice Increase fiber in diet Follow up as needed    The above assessment and management plan was discussed with the patient. The patient verbalized understanding of and has agreed to the management plan. Patient is aware to call the clinic if symptoms persist or worsen. Patient is aware when to return to the clinic for a follow-up visit. Patient educated on when it is appropriate to go to the emergency department.   Troy Daphine Deutscher, FNP

## 2021-01-13 NOTE — Patient Instructions (Signed)

## 2022-05-01 ENCOUNTER — Ambulatory Visit (INDEPENDENT_AMBULATORY_CARE_PROVIDER_SITE_OTHER): Payer: BC Managed Care – PPO | Admitting: Family

## 2022-05-01 ENCOUNTER — Encounter: Payer: Self-pay | Admitting: Family

## 2022-05-01 DIAGNOSIS — R051 Acute cough: Secondary | ICD-10-CM

## 2022-05-01 DIAGNOSIS — M791 Myalgia, unspecified site: Secondary | ICD-10-CM

## 2022-05-01 DIAGNOSIS — Z20828 Contact with and (suspected) exposure to other viral communicable diseases: Secondary | ICD-10-CM | POA: Diagnosis not present

## 2022-05-01 MED ORDER — BENZONATATE 200 MG PO CAPS: 200.0000 mg | ORAL_CAPSULE | Freq: Three times a day (TID) | ORAL | 1 refills | Status: AC | PRN

## 2022-05-01 MED ORDER — PREDNISONE 10 MG (21) PO TBPK: ORAL_TABLET | ORAL | 0 refills | Status: AC

## 2022-05-01 MED ORDER — ALBUTEROL SULFATE HFA 108 (90 BASE) MCG/ACT IN AERS: 2.0000 | INHALATION_SPRAY | Freq: Four times a day (QID) | RESPIRATORY_TRACT | 0 refills | Status: AC | PRN

## 2022-05-01 NOTE — Progress Notes (Signed)
Virtual Visit  Note Due to COVID-19 pandemic this visit was conducted virtually. This visit type was conducted due to national recommendations for restrictions regarding the COVID-19 Pandemic (e.g. social distancing, sheltering in place) in an effort to limit this patient's exposure and mitigate transmission in our community. All issues noted in this document were discussed and addressed.  A physical exam was not performed with this format.  I connected with Troy Baker on 05/01/22 at 12:01 pm  by telephone and verified that I am speaking with the correct person using two identifiers. Troy Baker is currently located at home and no one is currently with him during visit. The provider, Jannifer Rodney, FNP is located in their office at time of visit.  I discussed the limitations, risks, security and privacy concerns of performing an evaluation and management service by telephone and the availability of in person appointments. I also discussed with the patient that there may be a patient responsible charge related to this service. The patient expressed understanding and agreed to proceed.  Mr. Troy, Baker are scheduled for a virtual visit with your provider today.    Just as we do with appointments in the office, we must obtain your consent to participate.  Your consent will be active for this visit and any virtual visit you may have with one of our providers in the next 365 days.    If you have a MyChart account, I can also send a copy of this consent to you electronically.  All virtual visits are billed to your insurance company just like a traditional visit in the office.  As this is a virtual visit, video technology does not allow for your provider to perform a traditional examination.  This may limit your provider's ability to fully assess your condition.  If your provider identifies any concerns that need to be evaluated in person or the need to arrange testing such as labs, EKG, etc, we  will make arrangements to do so.    Although advances in technology are sophisticated, we cannot ensure that it will always work on either your end or our end.  If the connection with a video visit is poor, we may have to switch to a telephone visit.  With either a video or telephone visit, we are not always able to ensure that we have a secure connection.   I need to obtain your verbal consent now.   Are you willing to proceed with your visit today?   [name] has provided verbal consent on 05/01/2022 for a virtual visit (video or telephone).   Jannifer Rodney, [place] 05/01/2022  12:02 PM   History and Present Illness:  PT calls the office today with cough that started Tuesday. His grandson was diagnosed with RSV last week.  Cough This is a new problem. The current episode started in the past 7 days. The problem has been unchanged. The problem occurs constantly. The cough is Non-productive. Associated symptoms include a fever, headaches, myalgias, nasal congestion, a sore throat (in the morning) and wheezing. Pertinent negatives include no chills, ear congestion, ear pain or shortness of breath. Risk factors for lung disease include smoking/tobacco exposure. He has tried rest for the symptoms.      Review of Systems  Constitutional:  Positive for fever. Negative for chills.  HENT:  Positive for sore throat (in the morning). Negative for ear pain.   Respiratory:  Positive for cough and wheezing. Negative for shortness of breath.  Musculoskeletal:  Positive for myalgias.  Neurological:  Positive for headaches.     Observations/Objective: No SOB or distress noted, nonproductive cough   Assessment and Plan: 1. RSV exposure - predniSONE (STERAPRED UNI-PAK 21 TAB) 10 MG (21) TBPK tablet; Use as directed  Dispense: 21 tablet; Refill: 0 - benzonatate (TESSALON) 200 MG capsule; Take 1 capsule (200 mg total) by mouth 3 (three) times daily as needed.  Dispense: 30 capsule; Refill: 1 -  albuterol (VENTOLIN HFA) 108 (90 Base) MCG/ACT inhaler; Inhale 2 puffs into the lungs every 6 (six) hours as needed for wheezing or shortness of breath.  Dispense: 8 g; Refill: 0  2. Acute cough - predniSONE (STERAPRED UNI-PAK 21 TAB) 10 MG (21) TBPK tablet; Use as directed  Dispense: 21 tablet; Refill: 0 - benzonatate (TESSALON) 200 MG capsule; Take 1 capsule (200 mg total) by mouth 3 (three) times daily as needed.  Dispense: 30 capsule; Refill: 1 - albuterol (VENTOLIN HFA) 108 (90 Base) MCG/ACT inhaler; Inhale 2 puffs into the lungs every 6 (six) hours as needed for wheezing or shortness of breath.  Dispense: 8 g; Refill: 0  3. Myalgia   Start prednisone, tessalon, and albuterol as needed Force fluids Rest Tylenol  Good hand hygiene  Follow up if symptoms worsen or do not improve      I discussed the assessment and treatment plan with the patient. The patient was provided an opportunity to ask questions and all were answered. The patient agreed with the plan and demonstrated an understanding of the instructions.   The patient was advised to call back or seek an in-person evaluation if the symptoms worsen or if the condition fails to improve as anticipated.  The above assessment and management plan was discussed with the patient. The patient verbalized understanding of and has agreed to the management plan. Patient is aware to call the clinic if symptoms persist or worsen. Patient is aware when to return to the clinic for a follow-up visit. Patient educated on when it is appropriate to go to the emergency department.   Time call ended:  12:12 pm   I provided 11 minutes of  non face-to-face time during this encounter.    Jannifer Rodney, FNP

## 2023-04-14 DIAGNOSIS — R030 Elevated blood-pressure reading, without diagnosis of hypertension: Secondary | ICD-10-CM | POA: Diagnosis not present

## 2023-04-14 DIAGNOSIS — Z1211 Encounter for screening for malignant neoplasm of colon: Secondary | ICD-10-CM | POA: Diagnosis not present

## 2023-04-14 DIAGNOSIS — J449 Chronic obstructive pulmonary disease, unspecified: Secondary | ICD-10-CM | POA: Diagnosis not present

## 2023-04-14 DIAGNOSIS — Z122 Encounter for screening for malignant neoplasm of respiratory organs: Secondary | ICD-10-CM | POA: Diagnosis not present

## 2023-04-14 DIAGNOSIS — Z1330 Encounter for screening examination for mental health and behavioral disorders, unspecified: Secondary | ICD-10-CM | POA: Diagnosis not present

## 2023-04-14 DIAGNOSIS — F17219 Nicotine dependence, cigarettes, with unspecified nicotine-induced disorders: Secondary | ICD-10-CM | POA: Diagnosis not present

## 2023-05-04 DIAGNOSIS — M7521 Bicipital tendinitis, right shoulder: Secondary | ICD-10-CM | POA: Diagnosis not present

## 2023-05-04 DIAGNOSIS — M7551 Bursitis of right shoulder: Secondary | ICD-10-CM | POA: Diagnosis not present

## 2023-05-04 DIAGNOSIS — M75101 Unspecified rotator cuff tear or rupture of right shoulder, not specified as traumatic: Secondary | ICD-10-CM | POA: Diagnosis not present

## 2023-05-04 DIAGNOSIS — M25511 Pain in right shoulder: Secondary | ICD-10-CM | POA: Diagnosis not present

## 2023-05-13 DIAGNOSIS — Z716 Tobacco abuse counseling: Secondary | ICD-10-CM | POA: Diagnosis not present

## 2023-11-21 DEATH — deceased
